# Patient Record
Sex: Female | Born: 1955
Health system: Southern US, Community
[De-identification: ages and names within clinical notes are randomized; demographics above are authoritative.]

## PROBLEM LIST (undated history)

## (undated) DIAGNOSIS — S32009A Unspecified fracture of unspecified lumbar vertebra, initial encounter for closed fracture: Secondary | ICD-10-CM

## (undated) DIAGNOSIS — K429 Umbilical hernia without obstruction or gangrene: Secondary | ICD-10-CM

## (undated) DIAGNOSIS — J309 Allergic rhinitis, unspecified: Secondary | ICD-10-CM

## (undated) DIAGNOSIS — Z803 Family history of malignant neoplasm of breast: Secondary | ICD-10-CM

## (undated) DIAGNOSIS — I10 Essential (primary) hypertension: Secondary | ICD-10-CM

## (undated) DIAGNOSIS — Z9289 Personal history of other medical treatment: Secondary | ICD-10-CM

## (undated) DIAGNOSIS — E669 Obesity, unspecified: Secondary | ICD-10-CM

## (undated) HISTORY — DX: Family history of malignant neoplasm of breast: Z80.3

## (undated) HISTORY — DX: Umbilical hernia without obstruction or gangrene: K42.9

## (undated) HISTORY — DX: Unspecified fracture of unspecified lumbar vertebra, initial encounter for closed fracture: S32.009A

## (undated) HISTORY — PX: DILATION AND CURETTAGE OF UTERUS: SHX78

## (undated) HISTORY — DX: Essential (primary) hypertension: I10

## (undated) HISTORY — PX: BACK SURGERY: SHX140

## (undated) HISTORY — DX: Allergic rhinitis, unspecified: J30.9

## (undated) HISTORY — DX: Obesity, unspecified: E66.9

## (undated) HISTORY — DX: Personal history of other medical treatment: Z92.89

---

## 2001-03-08 HISTORY — PX: HYSTEROSCOPY W/D&C: SHX1775

## 2001-03-08 HISTORY — PX: HYSTEROSCOPY WITH D & C: SHX1775

## 2005-08-16 ENCOUNTER — Ambulatory Visit: Payer: Self-pay | Admitting: Unknown Physician Specialty

## 2006-08-22 ENCOUNTER — Ambulatory Visit: Payer: Self-pay | Admitting: Unknown Physician Specialty

## 2007-08-24 ENCOUNTER — Ambulatory Visit: Payer: Self-pay | Admitting: Unknown Physician Specialty

## 2008-08-30 ENCOUNTER — Ambulatory Visit: Payer: Self-pay | Admitting: Unknown Physician Specialty

## 2009-01-06 HISTORY — PX: COLONOSCOPY: SHX174

## 2009-01-24 ENCOUNTER — Ambulatory Visit: Payer: Self-pay | Admitting: Gastroenterology

## 2009-09-05 ENCOUNTER — Ambulatory Visit: Payer: Self-pay | Admitting: Unknown Physician Specialty

## 2010-10-06 ENCOUNTER — Ambulatory Visit: Payer: Self-pay | Admitting: Unknown Physician Specialty

## 2011-10-13 ENCOUNTER — Ambulatory Visit: Payer: Self-pay | Admitting: Obstetrics and Gynecology

## 2012-10-17 ENCOUNTER — Ambulatory Visit: Payer: Self-pay | Admitting: Obstetrics and Gynecology

## 2013-10-18 ENCOUNTER — Ambulatory Visit: Payer: Self-pay

## 2014-10-14 ENCOUNTER — Other Ambulatory Visit: Payer: Self-pay | Admitting: Certified Nurse Midwife

## 2014-10-14 DIAGNOSIS — Z1231 Encounter for screening mammogram for malignant neoplasm of breast: Secondary | ICD-10-CM

## 2014-10-21 ENCOUNTER — Ambulatory Visit
Admission: RE | Admit: 2014-10-21 | Discharge: 2014-10-21 | Disposition: A | Discharge status: Home / Self Care | Payer: BLUE CROSS/BLUE SHIELD | Source: Ambulatory Visit | Attending: Certified Nurse Midwife | Admitting: Certified Nurse Midwife

## 2014-10-21 DIAGNOSIS — Z1231 Encounter for screening mammogram for malignant neoplasm of breast: Secondary | ICD-10-CM | POA: Diagnosis not present

## 2015-04-21 DIAGNOSIS — S32009A Unspecified fracture of unspecified lumbar vertebra, initial encounter for closed fracture: Secondary | ICD-10-CM

## 2015-04-21 HISTORY — DX: Unspecified fracture of unspecified lumbar vertebra, initial encounter for closed fracture: S32.009A

## 2015-04-24 ENCOUNTER — Emergency Department
Admission: EM | Admit: 2015-04-24 | Discharge: 2015-04-24 | Disposition: A | Discharge status: Home / Self Care | Payer: BLUE CROSS/BLUE SHIELD | Attending: Emergency Medicine | Admitting: Emergency Medicine

## 2015-04-24 ENCOUNTER — Encounter: Payer: Self-pay | Admitting: Emergency Medicine

## 2015-04-24 ENCOUNTER — Emergency Department: Payer: BLUE CROSS/BLUE SHIELD

## 2015-04-24 DIAGNOSIS — S32009A Unspecified fracture of unspecified lumbar vertebra, initial encounter for closed fracture: Secondary | ICD-10-CM

## 2015-04-24 DIAGNOSIS — I10 Essential (primary) hypertension: Secondary | ICD-10-CM | POA: Diagnosis not present

## 2015-04-24 DIAGNOSIS — S32058A Other fracture of fifth lumbar vertebra, initial encounter for closed fracture: Secondary | ICD-10-CM | POA: Insufficient documentation

## 2015-04-24 DIAGNOSIS — Y9289 Other specified places as the place of occurrence of the external cause: Secondary | ICD-10-CM | POA: Diagnosis not present

## 2015-04-24 DIAGNOSIS — S3992XA Unspecified injury of lower back, initial encounter: Secondary | ICD-10-CM | POA: Diagnosis present

## 2015-04-24 DIAGNOSIS — Y9389 Activity, other specified: Secondary | ICD-10-CM | POA: Insufficient documentation

## 2015-04-24 DIAGNOSIS — W010XXA Fall on same level from slipping, tripping and stumbling without subsequent striking against object, initial encounter: Secondary | ICD-10-CM | POA: Diagnosis not present

## 2015-04-24 DIAGNOSIS — Y998 Other external cause status: Secondary | ICD-10-CM | POA: Diagnosis not present

## 2015-04-24 MED ORDER — OXYCODONE-ACETAMINOPHEN 7.5-325 MG PO TABS: 1.0000 | ORAL_TABLET | ORAL | Status: AC | PRN

## 2015-04-24 MED ORDER — OXYCODONE-ACETAMINOPHEN 5-325 MG PO TABS
1.0000 | ORAL_TABLET | Freq: Once | ORAL | Status: AC
  Administered 2015-04-24: 1 via ORAL
  Filled 2015-04-24: qty 1

## 2015-04-24 MED ORDER — IBUPROFEN 800 MG PO TABS
800.0000 mg | ORAL_TABLET | Freq: Once | ORAL | Status: AC
  Administered 2015-04-24: 800 mg via ORAL

## 2015-04-24 MED ORDER — IBUPROFEN 600 MG PO TABS: 600.0000 mg | ORAL_TABLET | Freq: Three times a day (TID) | ORAL | Status: DC | PRN

## 2015-04-24 MED ORDER — IBUPROFEN 800 MG PO TABS
ORAL_TABLET | ORAL | Status: AC
  Filled 2015-04-24: qty 1

## 2015-04-24 NOTE — ED Provider Notes (Signed)
[facility] Emergency Department Provider Note  ____________________________________________  Time seen: Approximately 9:16 PM  I have reviewed the triage vital signs and the nursing notes.   HISTORY  Chief Complaint Back Pain    HPI Sierra Gonzalez is a 60 y.o. female patient complaining of low back pain secondary to fall 4 days ago.He slipped and fell on steps trying to avoid a dog. Patient states increasing low back and coccyx pain since the incident. Patient stated over-the-counter anti-inflammatories has not helped her condition. Patient denies any radicular component to this pain she denies any bladder or bowel dysfunction. Patient rates the pain as a 7/10. Patient describes the pain as "sharp".   History reviewed. No pertinent past medical history.  There are no active problems to display for this patient.   History reviewed. No pertinent past surgical history.  Current Outpatient Rx  Name  Route  Sig  Dispense  Refill  . ibuprofen (ADVIL,MOTRIN) 600 MG tablet   Oral   Take 1 tablet (600 mg total) by mouth every 8 (eight) hours as needed.   30 tablet   0   . oxyCODONE-acetaminophen (PERCOCET) 7.5-325 MG tablet   Oral   Take 1 tablet by mouth every 4 (four) hours as needed for severe pain.   20 tablet   0     Allergies Prednisone  Family History  Problem Relation Age of Onset  . Breast cancer Mother   . Breast cancer Sister   . Breast cancer Maternal Aunt   . Breast cancer Paternal Aunt   . Colon cancer Maternal Grandmother     Social History Social History  Substance Use Topics  . Smoking status: Never Smoker   . Smokeless tobacco: None  . Alcohol Use: No    Review of Systems Constitutional: No fever/chills Eyes: No visual changes. ENT: No sore throat. Cardiovascular: Denies chest pain. Respiratory: Denies shortness of breath. Gastrointestinal: No abdominal pain.  No nausea, no vomiting.  No diarrhea.  No  constipation. Genitourinary: Negative for dysuria. Musculoskeletal: Negative for back pain. Skin: Negative for rash. Neurological: Negative for headaches, focal weakness or numbness. Endocrine:Hypertension Allergic/Immunilogical: Prednisone  10-point ROS otherwise negative.  ____________________________________________   PHYSICAL EXAM:  VITAL SIGNS: ED Triage Vitals  Enc Vitals Group     BP 04/24/15 2052 155/81 mmHg     Pulse Rate 04/24/15 2052 79     Resp 04/24/15 2052 18     Temp 04/24/15 2052 98.4 F (36.9 C)     Temp Source 04/24/15 2052 Oral     SpO2 04/24/15 2052 95 %     Weight 04/24/15 2052 218 lb (98.884 kg)     Height 04/24/15 2052  (1.651 m)     Head Cir --      Peak Flow --      Pain Score 04/24/15 2053 7     Pain Loc --      Pain Edu? --      Excl. in GC? --     Constitutional: Alert and oriented. Well appearing and in no acute distress. Eyes: Conjunctivae are normal. PERRL. EOMI. Head: Atraumatic. Nose: No congestion/rhinnorhea. Mouth/Throat: Mucous membranes are moist.  Oropharynx non-erythematous. Neck: No stridor.  No cervical spine tenderness to palpation. Hematological/Lymphatic/Immunilogical: No cervical lymphadenopathy. Cardiovascular: Normal rate, regular rhythm. Grossly normal heart sounds.  Good peripheral circulation. Respiratory: Normal respiratory effort.  No retractions. Lungs CTAB. Gastrointestinal: Soft and nontender. No distention. No abdominal bruits. No CVA tenderness. Musculoskeletal:  No spinal deformity. Moderate guarding palpation L4-S1. Patient has negative straight leg test. Neurologic:  Normal speech and language. No gross focal neurologic deficits are appreciated. No gait instability. Skin:  Skin is warm, dry and intact. No rash noted. Psychiatric: Mood and affect are normal. Speech and behavior are normal.  ____________________________________________   LABS (all labs ordered are listed, but only abnormal results are  displayed)  Labs Reviewed - No data to display ____________________________________________  EKG   ____________________________________________  RADIOLOGY  Mild depressed superior  Endplate fracture L5.  I, Joni Reining, personally viewed and evaluated these images (plain radiographs) as part of my medical decision making, as well as reviewing the written report by the radiologist.  ____________________________________________   PROCEDURES  Procedure(s) performed: None  Critical Care performed: No  ____________________________________________   INITIAL IMPRESSION / ASSESSMENT AND PLAN / ED COURSE  Pertinent labs & imaging results that were available during my care of the patient were reviewed by me and considered in my medical decision making (see chart for details).  Mild depressed superior endplate fracture of L5 vertebral body. Discussed x-ray findings with patient. Patient given a prescription for Percocet and ibuprofen and advised follow-up with orthopedic doctor for further evaluation and treatment. ____________________________________________   FINAL CLINICAL IMPRESSION(S) / ED DIAGNOSES  Final diagnoses:  Fracture of lumbar vertebra, closed, initial encounter      Joni Reining, PA-C 04/24/15 2221  Myrna Blazer, MD 04/24/15 2322

## 2015-04-24 NOTE — ED Notes (Signed)
P_t arrived to the ED for complaints of lower back pain secondary to a fall sustained 4 days ago. Pt denies LOC. Pt is AOx4 in no apparent distress.

## 2015-04-24 NOTE — Discharge Instructions (Signed)
Lumbar Fracture °A lumbar fracture is a break in one of the bones of the lower back. Lumbar fractures range in severity. Severe fractures can damage the spinal cord. °CAUSES °This condition may be caused by: °· A fall (common). °· A car accident (common). °· A gunshot wound. °· A hard, direct hit to the back. °· Osteoporosis. °SYMPTOMS °The main symptom of this condition is severe pain in the lower back. If a fracture is complex or severe, there may also be: °· A misshapen or swollen area on the lower back. °· A limited ability to move an area of the lower back. °· An inability to empty the bladder or bowel. °· A loss of strength or sensation in the legs, feet, and toes. °· Paralysis. °DIAGNOSIS °This condition is diagnosed based on: °· A physical exam. °· Symptoms and what happened just before they developed. °· The results of imaging tests, such as an X-ray, CT scan, or MRI. °If your nerves have been damaged, you may also have other tests to find out how much damage there is. °TREATMENT °Treatment for this condition depends on the specifics of the injury. Most fractures can be treated with: °· A back brace. °· Bed rest and activity restrictions. °· Pain medicine. °· Physical therapy. °Fractures that are complex, involve multiple bones, or make the spine unstable may require surgery to remove pressure from the nerves or spinal cord and to stabilize the broken pieces of bone. °During recovery, it is normal to have pain and stiffness in the back for weeks. °HOME CARE INSTRUCTIONS °Medicines °· Take medicines only as directed by your health care provider. °· Do not drive or operate heavy machinery while taking pain medicine. ° Activity °· Stay in bed for as long as directed by your health care provider. °· If you were shown how to do any exercises to improve motion and strength in your back, do them as directed by your health care provider. °· Return to your normal activities as directed by your health care provider.  Ask your health care provider what activities are safe for you. °General Instructions °· If you were given a neck brace or back brace, wear it as directed by your health care provider. °· Keep all follow-up visits as directed by your health care provider. This is important. Failure to follow-up as recommended could result in permanent injury, disability, and long-lasting (chronic) pain. °SEEK MEDICAL CARE IF: °· Your pain does not improve over time. °· You have a persistent cough. °· You cannot return to your normal activities as planned or expected. °SEEK IMMEDIATE MEDICAL CARE IF: °· You have severe pain or your pain suddenly gets worse. °· You are unable to move. °· You have numbness, tingling, weakness, or paralysis in any part of your body. °· You cannot control your bladder or bowel. °· You have difficulty breathing. °· You have a fever. °· You have pain in your chest or abdomen. °· You vomit. °  °This information is not intended to replace advice given to you by your health care provider. Make sure you discuss any questions you have with your health care provider. °  °Document Released: 06/09/2006 Document Revised: 07/09/2014 Document Reviewed: 02/18/2014 °Elsevier Interactive Patient Education ©2016 [employer] ° °

## 2015-10-14 ENCOUNTER — Other Ambulatory Visit: Payer: Self-pay | Admitting: Certified Nurse Midwife

## 2015-10-14 DIAGNOSIS — Z1231 Encounter for screening mammogram for malignant neoplasm of breast: Secondary | ICD-10-CM

## 2015-10-30 ENCOUNTER — Other Ambulatory Visit: Payer: Self-pay | Admitting: Certified Nurse Midwife

## 2015-10-30 ENCOUNTER — Ambulatory Visit
Admission: RE | Admit: 2015-10-30 | Discharge: 2015-10-30 | Disposition: A | Discharge status: Home / Self Care | Payer: BLUE CROSS/BLUE SHIELD | Source: Ambulatory Visit | Attending: Certified Nurse Midwife | Admitting: Certified Nurse Midwife

## 2015-10-30 DIAGNOSIS — Z1231 Encounter for screening mammogram for malignant neoplasm of breast: Secondary | ICD-10-CM

## 2015-10-30 DIAGNOSIS — R928 Other abnormal and inconclusive findings on diagnostic imaging of breast: Secondary | ICD-10-CM | POA: Insufficient documentation

## 2015-11-05 ENCOUNTER — Other Ambulatory Visit: Payer: Self-pay | Admitting: Certified Nurse Midwife

## 2015-11-05 DIAGNOSIS — N6320 Unspecified lump in the left breast, unspecified quadrant: Secondary | ICD-10-CM

## 2015-11-05 DIAGNOSIS — R921 Mammographic calcification found on diagnostic imaging of breast: Secondary | ICD-10-CM

## 2015-11-28 ENCOUNTER — Ambulatory Visit
Admission: RE | Admit: 2015-11-28 | Discharge: 2015-11-28 | Disposition: A | Discharge status: Home / Self Care | Payer: BLUE CROSS/BLUE SHIELD | Source: Ambulatory Visit | Attending: Certified Nurse Midwife | Admitting: Certified Nurse Midwife

## 2015-11-28 DIAGNOSIS — N63 Unspecified lump in breast: Secondary | ICD-10-CM | POA: Diagnosis present

## 2015-11-28 DIAGNOSIS — R921 Mammographic calcification found on diagnostic imaging of breast: Secondary | ICD-10-CM

## 2015-11-28 DIAGNOSIS — N6320 Unspecified lump in the left breast, unspecified quadrant: Secondary | ICD-10-CM

## 2015-12-19 ENCOUNTER — Other Ambulatory Visit: Payer: Self-pay | Admitting: Certified Nurse Midwife

## 2015-12-19 DIAGNOSIS — R921 Mammographic calcification found on diagnostic imaging of breast: Secondary | ICD-10-CM

## 2016-05-28 ENCOUNTER — Ambulatory Visit
Admission: RE | Admit: 2016-05-28 | Discharge: 2016-05-28 | Disposition: A | Discharge status: Home / Self Care | Payer: BLUE CROSS/BLUE SHIELD | Source: Ambulatory Visit | Attending: Certified Nurse Midwife | Admitting: Certified Nurse Midwife

## 2016-05-28 ENCOUNTER — Other Ambulatory Visit: Payer: BLUE CROSS/BLUE SHIELD

## 2016-05-28 ENCOUNTER — Ambulatory Visit: Payer: BLUE CROSS/BLUE SHIELD

## 2016-05-28 DIAGNOSIS — R921 Mammographic calcification found on diagnostic imaging of breast: Secondary | ICD-10-CM

## 2016-11-16 ENCOUNTER — Encounter: Payer: Self-pay | Admitting: Certified Nurse Midwife

## 2016-11-16 ENCOUNTER — Ambulatory Visit (INDEPENDENT_AMBULATORY_CARE_PROVIDER_SITE_OTHER): Payer: BLUE CROSS/BLUE SHIELD | Admitting: Certified Nurse Midwife

## 2016-11-16 VITALS — BP 130/80 | HR 72 | Ht 65.0 in | Wt 220.0 lb

## 2016-11-16 DIAGNOSIS — Z803 Family history of malignant neoplasm of breast: Secondary | ICD-10-CM

## 2016-11-16 DIAGNOSIS — Z124 Encounter for screening for malignant neoplasm of cervix: Secondary | ICD-10-CM

## 2016-11-16 DIAGNOSIS — Z1239 Encounter for other screening for malignant neoplasm of breast: Secondary | ICD-10-CM

## 2016-11-16 DIAGNOSIS — R921 Mammographic calcification found on diagnostic imaging of breast: Secondary | ICD-10-CM

## 2016-11-16 DIAGNOSIS — Z01419 Encounter for gynecological examination (general) (routine) without abnormal findings: Secondary | ICD-10-CM | POA: Diagnosis not present

## 2016-11-16 DIAGNOSIS — Z1231 Encounter for screening mammogram for malignant neoplasm of breast: Secondary | ICD-10-CM

## 2016-11-16 NOTE — Progress Notes (Signed)
Gynecology Annual Exam  PCP: [name], CNM  Chief Complaint:  Chief Complaint  Patient presents with  . Gynecologic Exam    pain lower right buttock that goes down leg    History of Present Illness:Sierra Gonzalez presents today for her annual gyn exam. She is a 61 year old African American/Black female, G2 P2002, whose LMP was 08/07/2007. She is having no significant GYN problems.  Her menses are absent and she is postmenopausal. She does have occ hot flashes, especially with exertion, but they have decreased in frequency. She has had no spotting.   The patient's past medical history is notable for a history of hypertension, an umbilical hernia (unrepaired), and a very strong family history of breast cancer..  Since her last annual GYN exam dated 10/10/2015 , she has had no new problems until recently when she began having intermittent pain in her right buttock that would extend down her leg to her knee. No numbness or tingling in her right foot. This seems to bother her less when she wears her back brace. This pain was aggravated by riding in the car to the beach vacation without her back brace. Had surgery on her back when she was 61yo. She is not sexually active. She has been sexually active in the past.   Her most recent pap smear was obtained 10/10/2015 and was normal.  Her most recent annual mammogram on 10/30/15 and follow up left diagnostic mammogram 11/28/15 revealed benign appearing calcifications. She had a follow up left diagnostic mammogram in March 2018 and the calcifications were stable. There is a positive history of breast cancer in her mother, sister, maternal aunt, and Two paternal aunts. Genetic testing has not been done. There is no family history of ovarian cancer. The patient does do monthly self breast exams.  She had a colonoscopy in 01/2009 that was normal. Her next colonoscopy is due in 10 years.  She denies a recent DEXA scan and is eligible.  The  patient does not smoke.  The patient does not drink alcohol.  The patient does not use illegal drugs.  The patient exercises occasionally.  The patient does get adequate calcium in her diet.  She had a recent cholesterol screen and EKG at her PCP and reports everything was normal. The patient denies current symptoms of depression.    Review of Systems: Review of Systems  Constitutional: Negative for chills, fever and weight loss.       Positive for 8# weight gain in the past year.  HENT: Negative for congestion, sinus pain and sore throat.   Eyes: Negative for blurred vision and pain.  Respiratory: Negative for hemoptysis, shortness of breath and wheezing.   Cardiovascular: Negative for chest pain, palpitations and leg swelling.  Gastrointestinal: Negative for abdominal pain, blood in stool, diarrhea, heartburn, nausea and vomiting.  Genitourinary: Negative for dysuria, frequency, hematuria and urgency.  Musculoskeletal: Positive for back pain. Negative for joint pain and myalgias.       Positive for pain in right buttock radiating to knee  Skin: Negative for itching and rash.  Neurological: Negative for dizziness, tingling and headaches.  Endo/Heme/Allergies: Negative for environmental allergies and polydipsia. Does not bruise/bleed easily.       Negative for hirsutism   Psychiatric/Behavioral: Negative for depression. The patient is not nervous/anxious and does not have insomnia.     Past Medical History:  Past Medical History:  Diagnosis Date  . Family history of breast cancer   .  Hypertension   . Obesity (BMI 30-39.9)   . Umbilical hernia     Past Surgical History:  Past Surgical History:  Procedure Laterality Date  . BACK SURGERY  age 80   had a bone inserted in upper spine  . CESAREAN SECTION     x2  . COLONOSCOPY  01/2009  . HYSTEROSCOPY W/D&C  2003    Family History:  Family History  Problem Relation Age of Onset  . Breast cancer Mother [id number]       premenopausal   . Breast cancer Sister 3638  . Breast cancer Maternal Aunt 5367  . Breast cancer Paternal Aunt 5460  . Colon cancer Maternal Grandmother [id number]  . Diabetes Father   . Hypertension Father   . Diabetes Mellitus II Brother   . Breast cancer Paternal Aunt [id number]    Social History:  Social History   Social History  . Marital status: Married    Spouse name: Winford  . Number of children: 2  . Years of education: N/A   Occupational History  . Not on file.   Social History Main Topics  . Smoking status: Never Smoker  . Smokeless tobacco: Never Used  . Alcohol use No  . Drug use: No  . Sexual activity: Not Currently    Partners: Male    Birth control/ protection: Post-menopausal   Other Topics Concern  . Not on file   Social History Narrative  . No narrative on file    Allergies:  Allergies  Allergen Reactions  . Prednisone Swelling    Medications:  Current Outpatient Prescriptions:  .  cetirizine (ZYRTEC) 5 MG tablet, Take 1 tablet by mouth daily., Disp: , Rfl:  .  ibuprofen (ADVIL,MOTRIN) 600 MG tablet, Take 1 tablet (600 mg total) by mouth every 8 (eight) hours as needed., Disp: 30 tablet, Rfl: 0 .  lisinopril-hydrochlorothiazide (PRINZIDE,ZESTORETIC) 10-12.5 MG tablet, Take 1 tablet by mouth daily., Disp: , Rfl: 0   Physical Exam Vitals: BP 130/80   Pulse 72   Ht 5\' 5"  (1.651 m)   Wt 220 lb (99.8 kg)   BMI 36.61 kg/m   General: BF in NAD HEENT: normocephalic, anicteric Neck: no thyroid enlargement, no palpable nodules, no cervical lymphadenopathy  Pulmonary: No increased work of breathing, CTAB Cardiovascular: RRR, without murmur  Breast: Breast large and symmetrical, no tenderness, no palpable nodules or masses, no skin or nipple retraction present, no nipple discharge.  No axillary, infraclavicular or supraclavicular lymphadenopathy. Abdomen: Soft, obese, non-tender, non-distended.  Umbilical hernia present.  No hepatomegaly or masses palpable.   Genitourinary:  External: Normal external female genitalia.  Normal urethral meatus, normal Bartholin's and Skene's glands.    Vagina: Normal vaginal mucosa, no evidence of prolapse.    Cervix: unable to visualize, no bleeding, non-tender  Uterus: Anteverted, normal size, shape, and consistency, mobile, and non-tender. Difficult exam due to body habitus  Adnexa: No adnexal masses, non-tender  Rectal: deferred  Lymphatic: no evidence of inguinal lymphadenopathy Extremities: no edema, erythema, or tenderness Neurologic: Grossly intact Psychiatric: mood appropriate, affect full     Assessment: 61 y.o. G2P0002 annual gyn exam Left breast calcifications Umbilical hernia-asymptomatic Family history of breast cancer Possible right sciatica- discussed stretches that may help alleviate discomfort.  Plan:   1) Breast cancer screening - recommend monthly self breast exam and annual mammograms. Needs bilateral diagnostic mammogram scheduled. Mammogram was ordered today. Has been offered genetic testing in the past and has declined  2) Colon cancer screening: colonoscopy  due in 2020  3) Cervical cancer screening - Pap was done. ASCCP guidelines and rational discussed.  Patient opts for yearly screening interval  4) Osteoporosis prevention: discussed calcium and vitamin D3 requirements. Discussed importance of exercise. Plan for DEXA scan next year.  5) Routine healthcare maintenance including cholesterol and diabetes screening managed by PCP   6) RTO in 1 year and prn

## 2016-11-17 ENCOUNTER — Telehealth: Payer: Self-pay | Admitting: Certified Nurse Midwife

## 2016-11-17 ENCOUNTER — Encounter: Payer: Self-pay | Admitting: Certified Nurse Midwife

## 2016-11-17 DIAGNOSIS — E669 Obesity, unspecified: Secondary | ICD-10-CM | POA: Insufficient documentation

## 2016-11-17 DIAGNOSIS — Z803 Family history of malignant neoplasm of breast: Secondary | ICD-10-CM | POA: Insufficient documentation

## 2016-11-17 DIAGNOSIS — I10 Essential (primary) hypertension: Secondary | ICD-10-CM | POA: Insufficient documentation

## 2016-11-17 DIAGNOSIS — K429 Umbilical hernia without obstruction or gangrene: Secondary | ICD-10-CM | POA: Insufficient documentation

## 2016-11-17 NOTE — Telephone Encounter (Signed)
[facility] Thursday, 12/09/16, @ 2:00pm.

## 2016-11-18 ENCOUNTER — Encounter: Payer: Self-pay | Admitting: Obstetrics and Gynecology

## 2016-11-19 LAB — IGP, APTIMA HPV
HPV Aptima: NEGATIVE
PAP Smear Comment: 0

## 2016-12-09 ENCOUNTER — Ambulatory Visit
Admission: RE | Admit: 2016-12-09 | Discharge: 2016-12-09 | Disposition: A | Discharge status: Home / Self Care | Payer: BLUE CROSS/BLUE SHIELD | Source: Ambulatory Visit | Attending: Certified Nurse Midwife | Admitting: Certified Nurse Midwife

## 2016-12-09 DIAGNOSIS — R921 Mammographic calcification found on diagnostic imaging of breast: Secondary | ICD-10-CM | POA: Insufficient documentation

## 2016-12-09 DIAGNOSIS — Z1239 Encounter for other screening for malignant neoplasm of breast: Secondary | ICD-10-CM

## 2016-12-21 ENCOUNTER — Telehealth: Payer: Self-pay

## 2016-12-21 NOTE — Telephone Encounter (Signed)
Pt called stating she recently had an appointment with [facility] and the city of [place] was going to fax [facility] lab results. Pt wondering if [facility] had received them. If results have not been received call patient and she can bring a copy. [phone]  I checked in chart and I did not see any results. Message sent to [facility].

## 2016-12-21 NOTE — Telephone Encounter (Signed)
I do not see this in media. Have you received these lab results via fax? [employer]

## 2017-03-31 ENCOUNTER — Other Ambulatory Visit: Payer: Self-pay | Admitting: Family Medicine

## 2017-03-31 ENCOUNTER — Ambulatory Visit
Admission: RE | Admit: 2017-03-31 | Discharge: 2017-03-31 | Disposition: A | Discharge status: Home / Self Care | Payer: BLUE CROSS/BLUE SHIELD | Source: Ambulatory Visit | Attending: Family Medicine | Admitting: Family Medicine

## 2017-03-31 DIAGNOSIS — R600 Localized edema: Secondary | ICD-10-CM

## 2017-03-31 DIAGNOSIS — M79604 Pain in right leg: Secondary | ICD-10-CM

## 2017-11-29 ENCOUNTER — Other Ambulatory Visit (HOSPITAL_COMMUNITY)
Admission: RE | Admit: 2017-11-29 | Discharge: 2017-11-29 | Disposition: A | Discharge status: Home / Self Care | Payer: BLUE CROSS/BLUE SHIELD | Source: Ambulatory Visit | Attending: Obstetrics and Gynecology | Admitting: Obstetrics and Gynecology

## 2017-11-29 ENCOUNTER — Ambulatory Visit (INDEPENDENT_AMBULATORY_CARE_PROVIDER_SITE_OTHER): Payer: BLUE CROSS/BLUE SHIELD | Admitting: Certified Nurse Midwife

## 2017-11-29 ENCOUNTER — Encounter: Payer: Self-pay | Admitting: Certified Nurse Midwife

## 2017-11-29 VITALS — BP 150/90 | HR 60 | Ht 64.0 in | Wt 227.0 lb

## 2017-11-29 DIAGNOSIS — Z1231 Encounter for screening mammogram for malignant neoplasm of breast: Secondary | ICD-10-CM | POA: Diagnosis not present

## 2017-11-29 DIAGNOSIS — Z124 Encounter for screening for malignant neoplasm of cervix: Secondary | ICD-10-CM

## 2017-11-29 DIAGNOSIS — Z01419 Encounter for gynecological examination (general) (routine) without abnormal findings: Secondary | ICD-10-CM

## 2017-11-29 DIAGNOSIS — Z01411 Encounter for gynecological examination (general) (routine) with abnormal findings: Secondary | ICD-10-CM

## 2017-11-29 DIAGNOSIS — Z803 Family history of malignant neoplasm of breast: Secondary | ICD-10-CM

## 2017-11-29 DIAGNOSIS — Z1239 Encounter for other screening for malignant neoplasm of breast: Secondary | ICD-10-CM

## 2017-11-29 DIAGNOSIS — R7309 Other abnormal glucose: Secondary | ICD-10-CM

## 2017-11-29 NOTE — Progress Notes (Signed)
Gynecology Annual Exam  PCP: [name], CNM  Chief Complaint:  Chief Complaint  Patient presents with  . Gynecologic Exam    History of Present Illness:Sierra Gonzalez presents today for her annual gyn exam. She is a 62 year old African American/Black female, G2 P2002, whose LMP was 08/07/2007. She is having no significant GYN problems.  Her menses are absent and she is postmenopausal. She does have occ hot flashes. She has had no spotting.   The patient's past medical history is notable for a history of hypertension, an umbilical hernia (unrepaired), and a very strong family history of breast cancer. She also has a history of having back surgery when she was 62 years old. Since her last annual GYN exam dated 11/16/2016 , she has had no significant change in her health.  She is not sexually active. She has been sexually active in the past.   Her most recent pap smear was obtained 11/16/2016 and it was NIL/neg HRHPV  Her most recent annual mammogram on 12/09/2016 was benign/ stable. There is a positive history of breast cancer in her mother, sister, maternal aunt, and two paternal aunts. Genetic testing has not been done. There is no family history of ovarian cancer. The patient does do monthly self breast exams.  She had a colonoscopy in 01/2009 that was normal. Her next colonoscopy is due next year.  She denies a recent DEXA scan and is eligible.  The patient does not smoke.  The patient does not drink alcohol.  The patient does not use illegal drugs.  The patient exercises occasionally.  The patient may not get adequate calcium in her diet.  She had a recent cholesterol screen and EKG at her PCP and reports everything was normal; however, her hemoglobin A1C was 6.9%. Her lipid panel was normal. Her current BMI is 38.96 kg/m2 and she has gained 7# since last year. Her PCp has discussed weight loss, dietary changes and walking for exercise The patient denies current symptoms  of depression.    Review of Systems: Review of Systems  Constitutional: Negative for chills, fever and weight loss.       Positive for 7# weight gain in the past year and 15# weight gain in the last 2 years  HENT: Negative for congestion, sinus pain and sore throat.   Eyes: Negative for blurred vision and pain.  Respiratory: Negative for hemoptysis, shortness of breath and wheezing.   Cardiovascular: Negative for chest pain, palpitations and leg swelling.  Gastrointestinal: Negative for abdominal pain, blood in stool, diarrhea, heartburn, nausea and vomiting.  Genitourinary: Negative for dysuria, frequency, hematuria and urgency.  Musculoskeletal: Negative for back pain, joint pain and myalgias.  Skin: Negative for itching and rash.  Neurological: Negative for dizziness, tingling and headaches.  Endo/Heme/Allergies: Negative for environmental allergies and polydipsia. Does not bruise/bleed easily.       Negative for hirsutism   Psychiatric/Behavioral: Negative for depression. The patient is not nervous/anxious and does not have insomnia.     Past Medical History:  Past Medical History:  Diagnosis Date  . Allergic rhinitis   . Family history of breast cancer    9/18 genetic testing letter sent  . Fracture lumbar vertebra-closed ([facility]CC) 04/21/2015   FELL DOWN STEPS-FX A BONE IN LOWER PART OF HER BACK;   . History of mammogram 10/21/14; 11/28/15   NEG; ADD VIEWS LEFT BREAST-BIRADS 3  . Hypertension   . Obesity (BMI 30-39.9)   . Umbilical hernia  Past Surgical History:  Past Surgical History:  Procedure Laterality Date  . BACK SURGERY  age 43   had a bone inserted in upper spine  . CESAREAN SECTION     x2  . COLONOSCOPY  01/2009  . HYSTEROSCOPY W/D&C  2003    Family History:  Family History  Problem Relation Age of Onset  . Breast cancer Mother 2838       premenopausal  . Breast cancer Sister 3338  . Diabetes Sister   . Breast cancer Maternal Aunt 6667  . Breast cancer  Paternal Aunt 260  . Colon cancer Maternal Grandmother 7370  . Diabetes Father   . Hypertension Father   . Diabetes Mellitus II Brother   . Diabetes Brother   . Breast cancer Paternal Aunt 3260    Social History:  Social History   Socioeconomic History  . Marital status: Married    Spouse name: Winford  . Number of children: 2  . Years of education: 3214  . Highest education level: Not on file  Occupational History  . 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 Financial resource strain: Not on file  . Food insecurity:    Worry: Not on file    Inability: Not on file  . Transportation needs:    Medical: Not on file    Non-medical: Not on file  Tobacco Use  . Smoking status: Never Smoker  . Smokeless tobacco: Never Used  Substance and Sexual Activity  . Alcohol use: No  . Drug use: No  . Sexual activity: Not Currently    Partners: Male    Birth control/protection: Post-menopausal  Lifestyle  . Physical activity:    Days per week: Not on file    Minutes per session: Not on file  . Stress: Not on file  Relationships  . Social connections:    Talks on phone: Not on file    Gets together: Not on file    Attends religious service: Not on file    Active member of club or organization: Not on file    Attends meetings of clubs or organizations: Not on file    Relationship status: Not on file  . Intimate partner violence:    Fear of current or ex partner: Not on file    Emotionally abused: Not on file    Physically abused: Not on file    Forced sexual activity: Not on file  Other Topics Concern  . Not on file  Social History Narrative  . Not on file    Allergies:  Allergies  Allergen Reactions  . Prednisone Swelling    Medications:  Current Outpatient Medications:  .  cetirizine (ZYRTEC) 5 MG tablet, Take 1 tablet by mouth daily., Disp: , Rfl:  .  ibuprofen (ADVIL,MOTRIN) 600 MG tablet, Take 1 tablet (600 mg total) by mouth every 8  (eight) hours as needed., Disp: 30 tablet, Rfl: 0 .  lisinopril-hydrochlorothiazide (PRINZIDE,ZESTORETIC) 10-12.5 MG tablet, Take 1 tablet by mouth daily., Disp: , Rfl: 0   Physical Exam Vitals: BP (!) 150/90   Pulse 60   Ht 5\' 4"  (1.626 m)   Wt 227 lb (103 kg)   BMI 38.96 kg/m   General: BF in NAD HEENT: normocephalic, anicteric Neck: no thyroid enlargement, no palpable nodules, no cervical lymphadenopathy  Pulmonary: No increased work of breathing, CTAB Cardiovascular: RRR, without murmur  Breast: Breast large and symmetrical, no tenderness, no palpable nodules or masses,  no skin or nipple retraction present, no nipple discharge.  No axillary, infraclavicular or supraclavicular lymphadenopathy. Abdomen: Soft, obese, non-tender, non-distended.  Umbilical hernia present.  No hepatomegaly palpable.  Genitourinary:  External: Normal external female genitalia.  Normal urethral meatus, normal Bartholin's and Skene's glands.    Vagina: Normal vaginal mucosa, no evidence of prolapse.    Cervix: posterior, no bleeding, non-tender  Uterus: Anteverted, normal size, shape, and consistency, mobile, and non-tender. Difficult exam due to body habitus  Adnexa: No adnexal masses, non-tender  Rectal: deferred  Lymphatic: no evidence of inguinal lymphadenopathy Extremities: no edema, erythema, or tenderness Neurologic: Grossly intact Psychiatric: mood appropriate, affect full     Assessment: 62 y.o. [id number] annual gyn exam Umbilical hernia-asymptomatic Family history of breast cancer Elevated hemoglobin A1C: 6.9%   Plan:   1) Breast cancer screening - recommend monthly self breast exam and annual mammograms.  Mammogram was ordered today. Offered MYRISK testing due to family history of breast cancer. TC Model used to calculate lifetime risk of breast cancer and was 27.1%. Will send patient a copy of this. DIscussed increased surveillance for breast cancer including MRIs of the breast as well  as annual mammograms. Also discussed risk reducing strategies including Tamoxifen and subcutaneous mastectomies.  2) Colon cancer screening: colonoscopy due in 2020  3) Cervical cancer screening - Pap was done. ASCCP guidelines and rational discussed.  Patient opts for yearly screening interval  4) Osteoporosis prevention: discussed calcium and vitamin D3 requirements. Discussed importance of exercise. offered DEXA scan and she declines  5) Routine healthcare maintenance including cholesterol and diabetes screening managed by PCP   6) RTO in 1 year and prn

## 2017-11-29 NOTE — Patient Instructions (Signed)
Preventing Osteoporosis, Adult Osteoporosis is a condition that causes the bones to get weaker. With osteoporosis, the bones become thinner, and the normal spaces in bone tissue become larger. This can make the bones weak and cause them to break more easily. People who have osteoporosis are more likely to break their wrist, spine, or hip. Even a minor accident or injury can be enough to break weak bones. Osteoporosis can occur with aging. Your body constantly replaces old bone tissue with new tissue. As you get older, you may lose bone tissue more quickly, or it may be replaced more slowly. Osteoporosis is more likely to develop if you have poor nutrition or do not get enough calcium or vitamin D. Other lifestyle factors can also play a role. By making some diet and lifestyle changes, you can help to keep your bones healthy and help to prevent osteoporosis. What nutrition changes can be made? Nutrition plays an important role in maintaining healthy, strong bones.  Make sure you get enough calcium every day from food or from calcium supplements. ? If you are age 50 or younger, aim to get 1,000 mg of calcium every day. ? If you are older than age 50, aim to get 1,200 mg of calcium every day.  Try to get enough vitamin D every day. ? If you are age 70 or younger, aim to get 600 international units (IU) every day. ? If you are older than age 70, aim to get 800 international units every day.  Follow a healthy diet. Eat plenty of foods that contain calcium and vitamin D. ? Calcium is in milk, cheese, yogurt, and other dairy products. Some fish and vegetables are also good sources of calcium. Many foods such as cereals and breads have had calcium added to them (are fortified). Check nutrition labels to see how much calcium is in a food or drink. ? Foods that contain vitamin D include milk, cereals, salmon, and tuna. Your body also makes vitamin D when you are out in the sun. Bare skin exposure to the sun on  your face, arms, legs, or back for no more than 30 minutes a day, 2 times per week is more than enough. Beyond that, it is important to use sunblock to protect your skin from sunburn, which increases your risk for skin cancer.  What lifestyle changes can be made? Making changes in your everyday life can also play an important role in preventing osteoporosis.  Stay active and get exercise every day. Ask your health care provider what types of exercise are best for you.  Do not use any products that contain nicotine or tobacco, such as cigarettes and e-cigarettes. If you need help quitting, ask your health care provider.  Limit alcohol intake to no more than 1 drink a day for nonpregnant women and 2 drinks a day for men. One drink equals 12 oz of beer, 5 oz of wine, or 1 oz of hard liquor.  Why are these changes important? Making these nutrition and lifestyle changes can:  Help you develop and maintain healthy, strong bones.  Prevent loss of bone mass and the problems that are caused by that loss, such as broken bones and delayed healing.  Make you feel better mentally and physically.  What can happen if changes are not made? Problems that can result from osteoporosis can be very serious. These may include:  A higher risk of broken bones that are painful and do not heal well.  Physical malformations, such as   a collapsed spine or a hunched back.  Problems with movement.  Where to find support: If you need help making changes to prevent osteoporosis, talk with your health care provider. You can ask for a referral to a diet and nutrition specialist (dietitian) and a physical therapist. Where to find more information: Learn more about osteoporosis from:  NIH Osteoporosis and Related Bone Diseases National Resource Center: www.niams.nih.gov/health_info/bone/osteoporosis/osteoporosis_ff.asp  U.S. Office on Women's Health:  www.womenshealth.gov/publications/our-publications/fact-sheet/osteoporosis.html  National Osteoporosis Foundation: www.nof.org/patients/what-is-osteoporosis/  Summary  Osteoporosis is a condition that causes weak bones that are more likely to break.  Eating a healthy diet and making sure you get enough calcium and vitamin D can help prevent osteoporosis.  Other ways to reduce your risk of osteoporosis include getting regular exercise and avoiding alcohol and products that contain nicotine or tobacco. This information is not intended to replace advice given to you by your health care provider. Make sure you discuss any questions you have with your health care provider. Document Released: 03/09/2015 Document Revised: 11/03/2015 Document Reviewed: 11/03/2015 Elsevier Interactive Patient Education  2018 Elsevier Inc.  

## 2017-11-30 DIAGNOSIS — R7309 Other abnormal glucose: Secondary | ICD-10-CM | POA: Insufficient documentation

## 2017-12-01 LAB — CYTOLOGY - PAP
Adequacy: ABSENT
Diagnosis: NEGATIVE

## 2017-12-22 ENCOUNTER — Ambulatory Visit
Admission: RE | Admit: 2017-12-22 | Discharge: 2017-12-22 | Disposition: A | Discharge status: Home / Self Care | Payer: BLUE CROSS/BLUE SHIELD | Source: Ambulatory Visit | Attending: Certified Nurse Midwife | Admitting: Certified Nurse Midwife

## 2017-12-22 DIAGNOSIS — Z803 Family history of malignant neoplasm of breast: Secondary | ICD-10-CM

## 2017-12-22 DIAGNOSIS — Z1239 Encounter for other screening for malignant neoplasm of breast: Secondary | ICD-10-CM | POA: Insufficient documentation

## 2018-12-08 ENCOUNTER — Ambulatory Visit: Payer: Self-pay

## 2018-12-08 DIAGNOSIS — Z23 Encounter for immunization: Secondary | ICD-10-CM

## 2018-12-13 ENCOUNTER — Encounter: Payer: Self-pay | Admitting: Certified Nurse Midwife

## 2018-12-13 ENCOUNTER — Ambulatory Visit (INDEPENDENT_AMBULATORY_CARE_PROVIDER_SITE_OTHER): Payer: 59 | Admitting: Certified Nurse Midwife

## 2018-12-13 ENCOUNTER — Other Ambulatory Visit: Payer: Self-pay

## 2018-12-13 VITALS — BP 116/70 | HR 81 | Ht 64.0 in | Wt 218.0 lb

## 2018-12-13 DIAGNOSIS — Z0184 Encounter for antibody response examination: Secondary | ICD-10-CM

## 2018-12-13 DIAGNOSIS — Z1231 Encounter for screening mammogram for malignant neoplasm of breast: Secondary | ICD-10-CM

## 2018-12-13 DIAGNOSIS — R7309 Other abnormal glucose: Secondary | ICD-10-CM

## 2018-12-13 DIAGNOSIS — Z01419 Encounter for gynecological examination (general) (routine) without abnormal findings: Secondary | ICD-10-CM | POA: Diagnosis not present

## 2018-12-13 DIAGNOSIS — Z1211 Encounter for screening for malignant neoplasm of colon: Secondary | ICD-10-CM

## 2018-12-13 DIAGNOSIS — Z803 Family history of malignant neoplasm of breast: Secondary | ICD-10-CM

## 2018-12-13 NOTE — Progress Notes (Signed)
Gynecology Annual Exam  PCP: Farrel ConnersGutierrez, Natsha Guidry, CNM  Chief Complaint:  Chief Complaint  Patient presents with  . Gynecologic Exam    hot flashes; pain elbow joints;    History of Present Illness:Sierra M. Christell ConstantMoore presents today for her annual gyn exam. She is a 63 year old African American/Black female, G2 P2002, whose LMP was 08/07/2007. She is having no significant GYN problems.  Her menses are absent and she is postmenopausal. She does have occ hot flashes. She has had no spotting.   The patient's past medical history is notable for a history of hypertension, an umbilical hernia (unrepaired), and a very strong family history of breast cancer. She also has a history of having back surgery when she was 63 years old. Since her last annual GYN exam dated 11/29/2017 , she has had no significant change in her health. She has lost 9# since her last visit. She is not sexually active. She has been sexually active in the past.   Her most recent pap smear was obtained 11/29/2017 and it was NIL Her most recent annual mammogram on 12/22/2017 was negative. There is a positive history of breast cancer in her mother, sister, maternal aunt, and two paternal aunts. Genetic testing has not been done. There is no family history of ovarian cancer. The patient does do monthly self breast exams.  She had a colonoscopy in 01/2009 that was normal. Her next colonoscopy is due this year  She denies a recent DEXA scan and is eligible.  The patient does not smoke.  The patient does not drink alcohol.  The patient does not use illegal drugs.  The patient exercises occasionally.  The patient has increased the calcium in her diet and probably gets adequate calcium intake. She had a recent cholesterol screen and EKG at her PCP last year and reports everything was normal; however, her hemoglobin A1C was 6.9%. Her lipid panel was normal. Her current BMI is 37.4 kg/m2 and she has lost 9# since last year after her  PCP discussed weight loss, dietary changes and walking for exercise. Has not had another hemoglobin A1C The patient denies current symptoms of depression.   Immunizations: Had flu shot last week. Discussed getting Shingrix, but she is unsure whether she had chickenpox as a child.  Review of Systems: Review of Systems  Constitutional: Positive for weight loss (intentional). Negative for chills and fever.  HENT: Negative for congestion, sinus pain and sore throat.   Eyes: Negative for blurred vision and pain.  Respiratory: Negative for hemoptysis, shortness of breath and wheezing (bil elbows, intermittently).   Cardiovascular: Negative for chest pain, palpitations and leg swelling.  Gastrointestinal: Negative for abdominal pain, blood in stool, diarrhea, heartburn, nausea and vomiting.  Genitourinary: Negative for dysuria, frequency, hematuria and urgency.  Musculoskeletal: Positive for joint pain. Negative for back pain and myalgias.  Skin: Negative for itching and rash.  Neurological: Negative for dizziness, tingling and headaches.  Endo/Heme/Allergies: Negative for environmental allergies and polydipsia. Does not bruise/bleed easily.       Negative for hirsutism   Psychiatric/Behavioral: Negative for depression. The patient is not nervous/anxious and does not have insomnia.     Past Medical History:  Past Medical History:  Diagnosis Date  . Allergic rhinitis   . Family history of breast cancer    9/18 genetic testing letter sent  . Fracture lumbar vertebra-closed ([facility]CC) 04/21/2015   FELL DOWN STEPS-FX A BONE IN LOWER PART OF HER BACK;   .  History of mammogram 10/21/14; 11/28/15   NEG; ADD VIEWS LEFT BREAST-BIRADS 3  . Hypertension   . Obesity (BMI 30-39.9)   . Umbilical hernia     Past Surgical History:  Past Surgical History:  Procedure Laterality Date  . BACK SURGERY  age 89   had a bone inserted in upper spine  . CESAREAN SECTION     x2  . COLONOSCOPY  01/2009  .  HYSTEROSCOPY W/D&C  2003    Family History:  Family History  Problem Relation Age of Onset  . Breast cancer Mother 65       premenopausal  . Breast cancer Sister 34  . Diabetes Sister   . Breast cancer Maternal Aunt 67  . Breast cancer Paternal Aunt 34  . Colon cancer Maternal Grandmother 55  . Diabetes Father   . Hypertension Father   . Diabetes Mellitus II Brother   . Diabetes Brother   . Breast cancer Paternal Aunt 48    Social History:  Social History   Socioeconomic History  . Marital status: Married    Spouse name: Winford  . Number of children: 2  . Years of education: 80  . Highest education level: Not on file  Occupational History  . 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 Financial resource strain: Not on file  . Food insecurity    Worry: Not on file    Inability: Not on file  . Transportation needs    Medical: Not on file    Non-medical: Not on file  Tobacco Use  . Smoking status: Never Smoker  . Smokeless tobacco: Never Used  Substance and Sexual Activity  . Alcohol use: No  . Drug use: No  . Sexual activity: Not Currently    Partners: Male    Birth control/protection: Post-menopausal  Lifestyle  . Physical activity    Days per week: Not on file    Minutes per session: Not on file  . Stress: Not on file  Relationships  . Social Herbalist on phone: Not on file    Gets together: Not on file    Attends religious service: Not on file    Active member of club or organization: Not on file    Attends meetings of clubs or organizations: Not on file    Relationship status: Not on file  . Intimate partner violence    Fear of current or ex partner: Not on file    Emotionally abused: Not on file    Physically abused: Not on file    Forced sexual activity: Not on file  Other Topics Concern  . Not on file  Social History Narrative  . Not on file    Allergies:  Allergies  Allergen Reactions  .  Prednisone Swelling    Medications:  Current Outpatient Medications:  .  cetirizine (ZYRTEC) 5 MG tablet, Take 1 tablet by mouth daily., Disp: , Rfl:  .  ibuprofen (ADVIL,MOTRIN) 600 MG tablet, Take 1 tablet (600 mg total) by mouth every 8 (eight) hours as needed., Disp: 30 tablet, Rfl: 0 .  lisinopril-hydrochlorothiazide (PRINZIDE,ZESTORETIC) 10-12.5 MG tablet, Take 1 tablet by mouth daily., Disp: , Rfl: 0   Physical Exam Vitals: BP 116/70   Pulse 81   Ht 5\' 4"  (1.626 m)   Wt 218 lb (98.9 kg)   BMI 37.42 kg/m   General: BF in NAD HEENT: normocephalic, anicteric Neck: no thyroid enlargement, no  palpable nodules, no cervical lymphadenopathy  Pulmonary: No increased work of breathing, CTAB Cardiovascular: RRR, without murmur  Breast: Breast large and symmetrical, no tenderness, no palpable nodules or masses, no skin or nipple retraction present, no nipple discharge.  No axillary, infraclavicular or supraclavicular lymphadenopathy. Abdomen: Soft, obese, non-tender.  5-6 cm umbilical hernia present just to the right of midline.  No hepatomegaly palpable.  Genitourinary:  External: Normal external female genitalia.  Normal urethral meatus, normal Bartholin's and Skene's glands.    Vagina: Normal vaginal mucosa, no evidence of prolapse, small vaginal opening due to atrophic changes  Cervix: posterior, no bleeding, non-tender  Uterus: Anteverted/ midplane, normal size, shape, and consistency, mobile, and non-tender. Difficult exam due to body habitus  Adnexa: No adnexal masses, non-tender  Rectal: deferred  Lymphatic: no evidence of inguinal lymphadenopathy Extremities: no edema, erythema, or tenderness Neurologic: Grossly intact Psychiatric: mood appropriate, affect full     Assessment: 63 y.o. G2P0002 annual gyn exam Umbilical hernia-asymptomatic Family history of breast cancer Elevated hemoglobin A1C: 6.9%   Plan:   1) Breast cancer screening - recommend monthly self breast  exam and annual mammograms.  Mammogram was ordered today. Offered MYRISK testing due to family history of breast cancer last year and declined. TC Model used to calculate lifetime risk of breast cancer and was 27.1%. DIscussed increased surveillance for breast cancer including MRIs of the breast as well as annual mammograms last year. Declined MRIs last year. Also discussed risk reducing strategies including Tamoxifen and subcutaneous mastectomies last year.   2) Colon cancer screening: colonoscopy due -will consult GI  3) Cervical cancer screening - Pap was not done.  Patient opts for every 3 year screening interval  4) Osteoporosis prevention: adequate calcium and vitamins D3 intake and exercise. Offer DEXA at age 28  5) Elevated hemoglobin A1C last year. Repeat today along with Varicella titer. May want Shingrix if she has a positive titer.   6) RTO in 1 year and prn. Will call with lab results.  [name], CNM

## 2018-12-14 LAB — VARICELLA ZOSTER ANTIBODY, IGG: Varicella zoster IgG: 2708 index (ref >165)

## 2018-12-14 LAB — HEMOGLOBIN A1C
Est. average glucose Bld gHb Est-mCnc: 154 mg/dL
Hgb A1c MFr Bld: 7 % — ABNORMAL HIGH (ref 4.8–5.6)

## 2018-12-15 ENCOUNTER — Encounter: Payer: Self-pay | Admitting: Certified Nurse Midwife

## 2018-12-21 DIAGNOSIS — Z20828 Contact with and (suspected) exposure to other viral communicable diseases: Secondary | ICD-10-CM | POA: Diagnosis not present

## 2019-02-05 DIAGNOSIS — Z20828 Contact with and (suspected) exposure to other viral communicable diseases: Secondary | ICD-10-CM | POA: Diagnosis not present

## 2019-03-13 ENCOUNTER — Ambulatory Visit
Admission: RE | Admit: 2019-03-13 | Discharge: 2019-03-13 | Disposition: A | Discharge status: Home / Self Care | Payer: Managed Care, Other (non HMO) | Source: Ambulatory Visit | Attending: Certified Nurse Midwife | Admitting: Certified Nurse Midwife

## 2019-03-13 DIAGNOSIS — Z1231 Encounter for screening mammogram for malignant neoplasm of breast: Secondary | ICD-10-CM | POA: Insufficient documentation

## 2019-03-13 DIAGNOSIS — Z803 Family history of malignant neoplasm of breast: Secondary | ICD-10-CM | POA: Insufficient documentation

## 2019-03-14 ENCOUNTER — Other Ambulatory Visit: Payer: Self-pay | Admitting: Certified Nurse Midwife

## 2019-03-14 DIAGNOSIS — R928 Other abnormal and inconclusive findings on diagnostic imaging of breast: Secondary | ICD-10-CM

## 2019-03-14 DIAGNOSIS — N6489 Other specified disorders of breast: Secondary | ICD-10-CM

## 2019-03-20 DIAGNOSIS — Z1211 Encounter for screening for malignant neoplasm of colon: Secondary | ICD-10-CM | POA: Diagnosis not present

## 2019-03-20 DIAGNOSIS — Z01818 Encounter for other preprocedural examination: Secondary | ICD-10-CM | POA: Diagnosis not present

## 2019-03-26 ENCOUNTER — Ambulatory Visit
Admission: RE | Admit: 2019-03-26 | Discharge: 2019-03-26 | Disposition: A | Discharge status: Home / Self Care | Payer: Managed Care, Other (non HMO) | Source: Ambulatory Visit | Attending: Certified Nurse Midwife | Admitting: Certified Nurse Midwife

## 2019-03-26 DIAGNOSIS — R928 Other abnormal and inconclusive findings on diagnostic imaging of breast: Secondary | ICD-10-CM

## 2019-03-26 DIAGNOSIS — N6489 Other specified disorders of breast: Secondary | ICD-10-CM | POA: Diagnosis not present

## 2019-03-26 DIAGNOSIS — R922 Inconclusive mammogram: Secondary | ICD-10-CM | POA: Diagnosis not present

## 2019-05-05 ENCOUNTER — Ambulatory Visit: Payer: Self-pay | Attending: Internal Medicine

## 2019-05-05 ENCOUNTER — Other Ambulatory Visit: Payer: Self-pay

## 2019-05-05 DIAGNOSIS — Z23 Encounter for immunization: Secondary | ICD-10-CM | POA: Insufficient documentation

## 2019-05-05 NOTE — Progress Notes (Signed)
   Covid-19 Vaccination Clinic  Name:  Sierra Gonzalez    MRN: [id number] DOB: August 23, 1955  05/05/2019  [name] was observed post Covid-19 immunization for 15 minutes without incidence. She was provided with Vaccine Information Sheet and instruction to access the V-Safe system.   [name] was instructed to call 911 with any severe reactions post vaccine: Marland Kitchen Difficulty breathing  . Swelling of your face and throat  . A fast heartbeat  . A bad rash all over your body  . Dizziness and weakness    Immunizations Administered    Name Date Dose VIS Date Route   Moderna COVID-19 Vaccine 05/05/2019  1:26 PM 0.5 mL 02/06/2019 Intramuscular   Manufacturer: Moderna   Lot: [id number]   NDC: 83014-159-73

## 2019-06-02 ENCOUNTER — Ambulatory Visit: Payer: Self-pay | Attending: Internal Medicine

## 2019-06-02 DIAGNOSIS — Z23 Encounter for immunization: Secondary | ICD-10-CM

## 2019-06-02 NOTE — Progress Notes (Signed)
   Covid-19 Vaccination Clinic  Name:  Sierra Gonzalez    MRN: [id number] DOB: 14-Sep-1955  06/02/2019  [name] was observed post Covid-19 immunization for 15 minutes without incident. She was provided with Vaccine Information Sheet and instruction to access the V-Safe system.   [name] was instructed to call 911 with any severe reactions post vaccine: Marland Kitchen Difficulty breathing  . Swelling of face and throat  . A fast heartbeat  . A bad rash all over body  . Dizziness and weakness   Immunizations Administered    Name Date Dose VIS Date Route   Moderna COVID-19 Vaccine 06/02/2019  1:10 PM 0.5 mL 02/06/2019 Intramuscular   Manufacturer: Gala Murdoch   Lot: [id number]   NDC: 46803-212-24

## 2019-08-27 ENCOUNTER — Other Ambulatory Visit: Payer: Self-pay

## 2019-08-27 DIAGNOSIS — I10 Essential (primary) hypertension: Secondary | ICD-10-CM

## 2019-08-27 MED ORDER — LISINOPRIL-HYDROCHLOROTHIAZIDE 10-12.5 MG PO TABS: 1.0000 | ORAL_TABLET | Freq: Every day | ORAL | 0 refills | Status: DC

## 2019-08-31 NOTE — Progress Notes (Signed)
Scheduled to complete physical 09/11/19 - Provider TBD.  AMD

## 2019-09-03 ENCOUNTER — Ambulatory Visit: Payer: Self-pay

## 2019-09-03 ENCOUNTER — Other Ambulatory Visit: Payer: Self-pay

## 2019-09-03 DIAGNOSIS — Z0000 Encounter for general adult medical examination without abnormal findings: Secondary | ICD-10-CM

## 2019-09-03 LAB — POCT URINALYSIS DIPSTICK
Bilirubin, UA: NEGATIVE
Glucose, UA: NEGATIVE
Ketones, UA: NEGATIVE
Leukocytes, UA: NEGATIVE
Nitrite, UA: NEGATIVE
Protein, UA: NEGATIVE
Spec Grav, UA: >=1.03 — AB (ref 1.010–1.025)
Urobilinogen, UA: 0.2 E.U./dL
pH, UA: 5.5 (ref 5.0–8.0)

## 2019-09-04 LAB — CMP12+LP+TP+TSH+6AC+CBC/D/PLT
ALT: 22 IU/L (ref 0–32)
AST: 8 IU/L (ref 0–40)
Albumin/Globulin Ratio: 1.7 (ref 1.2–2.2)
Albumin: 4.5 g/dL (ref 3.8–4.8)
Alkaline Phosphatase: 58 IU/L (ref 48–121)
BUN/Creatinine Ratio: 17 (ref 12–28)
BUN: 15 mg/dL (ref 8–27)
Basophils Absolute: 0 10*3/uL (ref 0.0–0.2)
Basos: 0 %
Bilirubin Total: 0.2 mg/dL (ref 0.0–1.2)
Calcium: 9.6 mg/dL (ref 8.7–10.3)
Chloride: 100 mmol/L (ref 96–106)
Chol/HDL Ratio: 2.8 ratio (ref 0.0–4.4)
Cholesterol, Total: 155 mg/dL (ref 100–199)
Creatinine, Ser: 0.89 mg/dL (ref 0.57–1.00)
EOS (ABSOLUTE): 0.3 10*3/uL (ref 0.0–0.4)
Eos: 5 %
Estimated CHD Risk: <0.5 times avg. (ref 0.0–1.0)
Free Thyroxine Index: 1.4 (ref 1.2–4.9)
GFR calc Af Amer: 80 mL/min/{1.73_m2} (ref >59)
GFR calc non Af Amer: 70 mL/min/{1.73_m2} (ref >59)
GGT: 21 IU/L (ref 0–60)
Globulin, Total: 2.6 g/dL (ref 1.5–4.5)
Glucose: 152 mg/dL — ABNORMAL HIGH (ref 65–99)
HDL: 56 mg/dL (ref >39)
Hematocrit: 36.6 % (ref 34.0–46.6)
Hemoglobin: 12.1 g/dL (ref 11.1–15.9)
Immature Grans (Abs): 0 10*3/uL (ref 0.0–0.1)
Immature Granulocytes: 0 %
Iron: 86 ug/dL (ref 27–139)
LDH: 177 IU/L (ref 119–226)
LDL Chol Calc (NIH): 82 mg/dL (ref 0–99)
Lymphocytes Absolute: 2 10*3/uL (ref 0.7–3.1)
Lymphs: 41 %
MCH: 29.2 pg (ref 26.6–33.0)
MCHC: 33.1 g/dL (ref 31.5–35.7)
MCV: 88 fL (ref 79–97)
Monocytes Absolute: 0.5 10*3/uL (ref 0.1–0.9)
Monocytes: 10 %
Neutrophils Absolute: 2.1 10*3/uL (ref 1.4–7.0)
Neutrophils: 44 %
Phosphorus: 4.8 mg/dL — ABNORMAL HIGH (ref 3.0–4.3)
Platelets: 368 10*3/uL (ref 150–450)
Potassium: 4.6 mmol/L (ref 3.5–5.2)
RBC: 4.14 x10E6/uL (ref 3.77–5.28)
RDW: 13.1 % (ref 11.7–15.4)
Sodium: 139 mmol/L (ref 134–144)
T3 Uptake Ratio: 24 % (ref 24–39)
T4, Total: 5.8 ug/dL (ref 4.5–12.0)
TSH: 5.07 u[IU]/mL — ABNORMAL HIGH (ref 0.450–4.500)
Total Protein: 7.1 g/dL (ref 6.0–8.5)
Triglycerides: 90 mg/dL (ref 0–149)
Uric Acid: 5.5 mg/dL (ref 3.0–7.2)
VLDL Cholesterol Cal: 17 mg/dL (ref 5–40)
WBC: 4.8 10*3/uL (ref 3.4–10.8)

## 2019-09-19 ENCOUNTER — Ambulatory Visit: Payer: Self-pay | Admitting: Physician Assistant

## 2019-09-19 ENCOUNTER — Other Ambulatory Visit: Payer: Self-pay

## 2019-09-19 VITALS — BP 123/66 | HR 85 | Temp 98.2°F | Resp 14 | Ht 65.0 in | Wt 222.0 lb

## 2019-09-19 DIAGNOSIS — Z0000 Encounter for general adult medical examination without abnormal findings: Secondary | ICD-10-CM

## 2019-09-19 DIAGNOSIS — I10 Essential (primary) hypertension: Secondary | ICD-10-CM

## 2019-09-19 MED ORDER — LISINOPRIL-HYDROCHLOROTHIAZIDE 10-12.5 MG PO TABS: 1.0000 | ORAL_TABLET | Freq: Every day | ORAL | 0 refills | Status: DC

## 2019-09-19 NOTE — Progress Notes (Signed)
   Subjective:Annual physical exam    Patient ID: Sierra Gonzalez, female    DOB: 01-Jan-1956, 65 y.o.   MRN: [id number]  HPI Patient present for annual physical exam. No concern or compliant.   Review of Systems    Hypertension Objective:   Physical Exam BMI 36.94. NAD. HEENT unremarkable. Neck supple without adenopathy or bruits. Lungs CTA. Heart RRR. Abdomen with negative HSM, normoactive BS, soft, and non tender to palpation.  No obvious deformity to upper/lower extremities. Full and equal ROM of the upper/lower extremities. No obvious deformity to cervical of lumbar spine.  Full and equal ROM of cervical/lumbar spine.  CN II-XII grossly intact.       Assessment & Plan:Well exam  Discuss lab results and refll BP medication.  Follow up PRN.

## 2019-12-17 ENCOUNTER — Ambulatory Visit: Payer: 59 | Admitting: Obstetrics and Gynecology

## 2019-12-21 ENCOUNTER — Encounter: Payer: Self-pay | Admitting: Obstetrics and Gynecology

## 2019-12-21 ENCOUNTER — Ambulatory Visit (INDEPENDENT_AMBULATORY_CARE_PROVIDER_SITE_OTHER): Payer: 59 | Admitting: Obstetrics and Gynecology

## 2019-12-21 ENCOUNTER — Other Ambulatory Visit: Payer: Self-pay

## 2019-12-21 VITALS — BP 120/80 | Ht 64.0 in | Wt 222.0 lb

## 2019-12-21 DIAGNOSIS — Z01419 Encounter for gynecological examination (general) (routine) without abnormal findings: Secondary | ICD-10-CM | POA: Diagnosis not present

## 2019-12-21 DIAGNOSIS — Z1231 Encounter for screening mammogram for malignant neoplasm of breast: Secondary | ICD-10-CM | POA: Diagnosis not present

## 2019-12-21 DIAGNOSIS — Z1211 Encounter for screening for malignant neoplasm of colon: Secondary | ICD-10-CM

## 2019-12-21 DIAGNOSIS — Z803 Family history of malignant neoplasm of breast: Secondary | ICD-10-CM

## 2019-12-21 DIAGNOSIS — Z9189 Other specified personal risk factors, not elsewhere classified: Secondary | ICD-10-CM | POA: Diagnosis not present

## 2019-12-21 NOTE — Patient Instructions (Addendum)
I value your feedback and entrusting us with your care. If you get a [facility] patient survey, I would appreciate you taking the time to let us know about your experience today. Thank you! ° °As of February 15, 2019, your lab results will be released to your MyChart immediately, before I even have a chance to see them. Please give me time to review them and contact you if there are any abnormalities. Thank you for your patience.  ° °[facility]: [phone] ° ° ° °

## 2019-12-21 NOTE — Progress Notes (Signed)
PCP: No primary care provider on file.   Chief Complaint  Patient presents with  . Gynecologic Exam    HPI:      [name] is a 64 y.o. G2P2002 whose LMP was No LMP recorded. Patient is postmenopausal., presents today for her annual examination.  Her menses are absent due to menopause. No PMB. She does have vasomotor sx.   Sex activity: not sexually active. She does not have vaginal dryness.  Last Pap: 11/29/17  Results were: no abnormalities /neg HPV DNA 2018  Last mammogram: 03/26/19  Results were: normal--routine follow-up in 12 months There is no FH of breast cancer in her mother, sister, maternal aunt, and two paternal aunts. Genetic testing declined by pt several times. IBIS=27.1%. There is no FH of ovarian cancer. The patient does do self-breast exams.  Colonoscopy: 2010  Repeat due after 10 years. Tried to do it last yr but [facility] not in network for her insurance.   Tobacco use: The patient denies current or previous tobacco use. Alcohol use: none   No drug use Exercise: moderately active  She does get adequate calcium but not Vitamin D in her diet.  Labs with PCP/through city. Hx of HTN.    Past Medical History:  Diagnosis Date  . Allergic rhinitis   . Family history of breast cancer    9/18 genetic testing letter sent  . Fracture lumbar vertebra-closed (HCC) 04/21/2015   FELL DOWN STEPS-FX A BONE IN LOWER PART OF HER BACK;   . History of mammogram 10/21/14; 11/28/15   NEG; ADD VIEWS LEFT BREAST-BIRADS 3  . Hypertension   . Obesity (BMI 30-39.9)   . Umbilical hernia     Past Surgical History:  Procedure Laterality Date  . BACK SURGERY  age 63   had a bone inserted in upper spine  . CESAREAN SECTION     x2  . COLONOSCOPY  01/2009  . HYSTEROSCOPY WITH D & C  2003    Family History  Problem Relation Age of Onset  . Breast cancer Mother 42       premenopausal  . Breast cancer Sister 68  . Diabetes Sister   . Breast cancer Maternal Aunt 64  .  Breast cancer Paternal Aunt 71  . Colon cancer Maternal Grandmother 27  . Diabetes Father   . Hypertension Father   . Diabetes Mellitus II Brother   . Breast cancer Paternal Aunt 74    Social History   Socioeconomic History  . Marital status: Married    Spouse name: Winford  . Number of children: 2  . Years of education: 32  . Highest education level: Not on file  Occupational History  . 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 Smoking status: Never Smoker  . Smokeless tobacco: Never Used  Vaping Use  . Vaping Use: Never used  Substance and Sexual Activity  . Alcohol use: No  . Drug use: No  . Sexual activity: Not Currently    Partners: Male    Birth control/protection: Post-menopausal  Other Topics Concern  . Not on file  Social History Narrative  . Not on file   Social Determinants of Health   Financial Resource Strain:   . Difficulty of Paying Living Expenses: Not on file  Food Insecurity:   . Worried About Programme researcher, broadcasting/film/video in the Last Year: Not on file  . Ran Out of Food in the Last  Year: Not on file  Transportation Needs:   . Lack of Transportation (Medical): Not on file  . Lack of Transportation (Non-Medical): Not on file  Physical Activity:   . Days of Exercise per Week: Not on file  . Minutes of Exercise per Session: Not on file  Stress:   . Feeling of Stress : Not on file  Social Connections:   . Frequency of Communication with Friends and Family: Not on file  . Frequency of Social Gatherings with Friends and Family: Not on file  . Attends Religious Services: Not on file  . Active Member of Clubs or Organizations: Not on file  . Attends Banker Meetings: Not on file  . Marital Status: Not on file  Intimate Partner Violence:   . Fear of Current or Ex-Partner: Not on file  . Emotionally Abused: Not on file  . Physically Abused: Not on file  . Sexually Abused: Not on file     Current Outpatient  Medications:  .  aspirin 81 MG EC tablet, Take by mouth., Disp: , Rfl:  .  cetirizine (ZYRTEC) 5 MG tablet, Take 1 tablet by mouth daily., Disp: , Rfl:  .  lisinopril-hydrochlorothiazide (ZESTORETIC) 10-12.5 MG tablet, Take 1 tablet by mouth daily., Disp: 90 tablet, Rfl: 0     ROS:  Review of Systems  Constitutional: Negative for fatigue, fever and unexpected weight change.  Respiratory: Negative for cough, shortness of breath and wheezing.   Cardiovascular: Negative for chest pain, palpitations and leg swelling.  Gastrointestinal: Negative for blood in stool, constipation, diarrhea, nausea and vomiting.  Endocrine: Negative for cold intolerance, heat intolerance and polyuria.  Genitourinary: Negative for dyspareunia, dysuria, flank pain, frequency, genital sores, hematuria, menstrual problem, pelvic pain, urgency, vaginal bleeding, vaginal discharge and vaginal pain.  Musculoskeletal: Positive for arthralgias. Negative for back pain, joint swelling and myalgias.  Skin: Negative for rash.  Neurological: Negative for dizziness, syncope, light-headedness, numbness and headaches.  Hematological: Negative for adenopathy.  Psychiatric/Behavioral: Negative for agitation, confusion, sleep disturbance and suicidal ideas. The patient is not nervous/anxious.    BREAST: No symptoms    Objective: BP 120/80   Ht 5\' 4"  (1.626 m)   Wt 222 lb (100.7 kg)   BMI 38.11 kg/m    Physical Exam Constitutional:      Appearance: She is well-developed.  Genitourinary:     Vulva, vagina, cervix, uterus, right adnexa and left adnexa normal.     No vulval lesion or tenderness noted.     No vaginal discharge, erythema or tenderness.     No cervical polyp.     Uterus is not enlarged or tender.     No right or left adnexal mass present.     Right adnexa not tender.     Left adnexa not tender.  Neck:     Thyroid: No thyromegaly.  Cardiovascular:     Rate and Rhythm: Normal rate and regular rhythm.      Heart sounds: Normal heart sounds. No murmur heard.   Pulmonary:     Effort: Pulmonary effort is normal.     Breath sounds: Normal breath sounds.  Chest:     Breasts:        Right: No mass, nipple discharge, skin change or tenderness.        Left: No mass, nipple discharge, skin change or tenderness.  Abdominal:     Palpations: Abdomen is soft.     Tenderness: There is no abdominal tenderness. There is  no guarding.  Musculoskeletal:        General: Normal range of motion.     Cervical back: Normal range of motion.  Neurological:     General: No focal deficit present.     Mental Status: She is alert and oriented to person, place, and time.     Cranial Nerves: No cranial nerve deficit.  Skin:    General: Skin is warm and dry.  Psychiatric:        Mood and Affect: Mood normal.        Behavior: Behavior normal.        Thought Content: Thought content normal.        Judgment: Judgment normal.  Vitals reviewed.     Assessment/Plan:  Encounter for annual routine gynecological examination  Encounter for screening mammogram for malignant neoplasm of breast - Plan: MM 3D SCREEN BREAST BILATERAL; pt to sched mammo  Family history of breast cancer - Plan: MM 3D SCREEN BREAST BILATERAL; MyRisk testing discussed and handout given. Pt to f/u if desires.   Increased risk of breast cancer - Plan: MM 3D SCREEN BREAST BILATERAL; Recommended monthly SBE, yearly CBE and mammos, as well as scr breast MRI. Pt needs to do mammo 1/22, call for scr breast MRI ref prn. Add Vit D supp.  Screening for colon cancer--pt to call insurance co to find out GI MDs/facility in network. Will do ref based on her info          GYN counsel breast self exam, mammography screening, menopause, adequate intake of calcium and vitamin D, diet and exercise    F/U  Return in about 1 year (around 12/20/2020).  Cathaleen Korol B. Jerzy Crotteau, PA-C 12/21/2019 4:29 PM

## 2020-01-18 ENCOUNTER — Other Ambulatory Visit: Payer: Self-pay

## 2020-01-18 DIAGNOSIS — Z23 Encounter for immunization: Secondary | ICD-10-CM

## 2020-02-21 ENCOUNTER — Other Ambulatory Visit: Payer: Self-pay | Admitting: Physician Assistant

## 2020-02-21 DIAGNOSIS — I10 Essential (primary) hypertension: Secondary | ICD-10-CM

## 2020-03-04 ENCOUNTER — Telehealth: Payer: Self-pay

## 2020-03-04 DIAGNOSIS — H5203 Hypermetropia, bilateral: Secondary | ICD-10-CM | POA: Diagnosis not present

## 2020-03-04 NOTE — Telephone Encounter (Signed)
Rx refill request already in Epic - re-routed to Ron Smith, PA-C.  AMD 

## 2020-03-06 ENCOUNTER — Telehealth: Payer: Self-pay

## 2020-03-06 NOTE — Telephone Encounter (Signed)
Refill request already in Epic - rerouted to [employer] [employer].  Thought it was sent to Ron 03/04/20, but must have done incorrectly. Spouse Leanord Hawking) in the clinic today & said Pharmacy told [facility] they haven't received refill authorization from physician.  Checked the med hx & it has last refill receipt at pharmacy on 09/19/19 which is the date of Viktoria's last physical at our clinic.  AMD

## 2020-03-07 ENCOUNTER — Other Ambulatory Visit: Payer: Self-pay | Admitting: Physician Assistant

## 2020-03-07 DIAGNOSIS — I10 Essential (primary) hypertension: Secondary | ICD-10-CM

## 2020-03-17 ENCOUNTER — Other Ambulatory Visit: Payer: Self-pay

## 2020-03-17 ENCOUNTER — Ambulatory Visit
Admission: RE | Admit: 2020-03-17 | Discharge: 2020-03-17 | Disposition: A | Discharge status: Home / Self Care | Payer: 59 | Source: Ambulatory Visit | Attending: Obstetrics and Gynecology | Admitting: Obstetrics and Gynecology

## 2020-03-17 DIAGNOSIS — Z1231 Encounter for screening mammogram for malignant neoplasm of breast: Secondary | ICD-10-CM | POA: Diagnosis not present

## 2020-03-17 DIAGNOSIS — Z803 Family history of malignant neoplasm of breast: Secondary | ICD-10-CM | POA: Insufficient documentation

## 2020-03-17 DIAGNOSIS — Z9189 Other specified personal risk factors, not elsewhere classified: Secondary | ICD-10-CM

## 2020-12-24 ENCOUNTER — Other Ambulatory Visit: Payer: Self-pay

## 2020-12-24 ENCOUNTER — Ambulatory Visit (INDEPENDENT_AMBULATORY_CARE_PROVIDER_SITE_OTHER): Payer: 59 | Admitting: Obstetrics and Gynecology

## 2020-12-24 ENCOUNTER — Encounter: Payer: Self-pay | Admitting: Obstetrics and Gynecology

## 2020-12-24 ENCOUNTER — Other Ambulatory Visit (HOSPITAL_COMMUNITY)
Admission: RE | Admit: 2020-12-24 | Discharge: 2020-12-24 | Disposition: A | Discharge status: Home / Self Care | Payer: 59 | Source: Ambulatory Visit | Attending: Obstetrics and Gynecology | Admitting: Obstetrics and Gynecology

## 2020-12-24 VITALS — BP 150/80 | Ht 64.0 in | Wt 223.0 lb

## 2020-12-24 DIAGNOSIS — Z1231 Encounter for screening mammogram for malignant neoplasm of breast: Secondary | ICD-10-CM

## 2020-12-24 DIAGNOSIS — Z803 Family history of malignant neoplasm of breast: Secondary | ICD-10-CM | POA: Diagnosis not present

## 2020-12-24 DIAGNOSIS — R69 Illness, unspecified: Secondary | ICD-10-CM | POA: Diagnosis not present

## 2020-12-24 DIAGNOSIS — Z1151 Encounter for screening for human papillomavirus (HPV): Secondary | ICD-10-CM | POA: Insufficient documentation

## 2020-12-24 DIAGNOSIS — Z01419 Encounter for gynecological examination (general) (routine) without abnormal findings: Secondary | ICD-10-CM

## 2020-12-24 DIAGNOSIS — Z9189 Other specified personal risk factors, not elsewhere classified: Secondary | ICD-10-CM

## 2020-12-24 DIAGNOSIS — Z23 Encounter for immunization: Secondary | ICD-10-CM

## 2020-12-24 DIAGNOSIS — Z124 Encounter for screening for malignant neoplasm of cervix: Secondary | ICD-10-CM

## 2020-12-24 DIAGNOSIS — Z1211 Encounter for screening for malignant neoplasm of colon: Secondary | ICD-10-CM | POA: Diagnosis not present

## 2020-12-24 NOTE — Progress Notes (Signed)
PCP: Rica Records, PA-C   Chief Complaint  Patient presents with   Gynecologic Exam    No concerns    HPI:      [name] is a 65 y.o. G2P2002 whose LMP was No LMP recorded. Patient is postmenopausal., presents today for her annual examination.  Her menses are absent due to menopause. No PMB. She does have vasomotor sx.   Sex activity: not sexually active. She does not have vaginal dryness.  Last Pap: 11/29/17  Results were: no abnormalities /neg HPV DNA 2018  Last mammogram: 03/17/20  Results were: normal--routine follow-up in 12 months There is no FH of breast cancer in her mother, sister, maternal aunt, and two paternal aunts. Genetic testing declined by pt several times. IBIS=27.1%. There is no FH of ovarian cancer. The patient does do self-breast exams.  Colonoscopy: 2010  Repeat due after 10 years. Tried to do it last but [facility] not in network for her insurance. Doesn't know who is in network.   Tobacco use: The patient denies current or previous tobacco use. Alcohol use: none   No drug use Exercise: moderately active  She does get adequate calcium and Vitamin D in her diet.  Labs with PCP/through city. Hx of HTN.    Past Medical History:  Diagnosis Date   Allergic rhinitis    Family history of breast cancer    9/18 genetic testing letter sent   Fracture lumbar vertebra-closed (HCC) 04/21/2015   FELL DOWN STEPS-FX A BONE IN LOWER PART OF HER BACK;    History of mammogram 10/21/14; 11/28/15   NEG; ADD VIEWS LEFT BREAST-BIRADS 3   Hypertension    Obesity (BMI 30-39.9)    Umbilical hernia     Past Surgical History:  Procedure Laterality Date   BACK SURGERY  age 8   had a bone inserted in upper spine   CESAREAN SECTION     x2   COLONOSCOPY  01/2009   HYSTEROSCOPY WITH D & C  2003    Family History  Problem Relation Age of Onset   Breast cancer Mother 36       premenopausal   Diabetes Father    Hypertension Father    Kidney cancer Father     Breast cancer Sister 57   Diabetes Sister    Diabetes Mellitus II Brother    Breast cancer Maternal Aunt 10   Breast cancer Paternal Aunt 30   Breast cancer Paternal Aunt 79   Colon cancer Maternal Grandmother 25    Social History   Socioeconomic History   Marital status: Married    Spouse name: Winford   Number of children: 2   Years of education: 14   Highest education level: Not on file  Occupational History   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  Tobacco Use   Smoking status: Never   Smokeless tobacco: Never  Vaping Use   Vaping Use: Never used  Substance and Sexual Activity   Alcohol use: No   Drug use: No   Sexual activity: Not Currently    Partners: Male    Birth control/protection: Post-menopausal  Other Topics Concern   Not on file  Social History Narrative   Not on file   Social Determinants of Health   Financial Resource Strain: Not on file  Food Insecurity: Not on file  Transportation Needs: Not on file  Physical Activity: Not on file  Stress: Not on file  Social  Connections: Not on file  Intimate Partner Violence: Not on file     Current Outpatient Medications:    aspirin 81 MG EC tablet, Take by mouth., Disp: , Rfl:    cetirizine (ZYRTEC) 5 MG tablet, Take 1 tablet by mouth daily., Disp: , Rfl:    lisinopril-hydrochlorothiazide (ZESTORETIC) 10-12.5 MG tablet, Take 1 tablet by mouth once daily, Disp: 90 tablet, Rfl: 2     ROS:  Review of Systems  Constitutional:  Negative for fatigue, fever and unexpected weight change.  Respiratory:  Negative for cough, shortness of breath and wheezing.   Cardiovascular:  Negative for chest pain, palpitations and leg swelling.  Gastrointestinal:  Negative for blood in stool, constipation, diarrhea, nausea and vomiting.  Endocrine: Negative for cold intolerance, heat intolerance and polyuria.  Genitourinary:  Negative for dyspareunia, dysuria, flank pain, frequency, genital sores,  hematuria, menstrual problem, pelvic pain, urgency, vaginal bleeding, vaginal discharge and vaginal pain.  Musculoskeletal:  Negative for arthralgias, back pain, joint swelling and myalgias.  Skin:  Negative for rash.  Neurological:  Negative for dizziness, syncope, light-headedness, numbness and headaches.  Hematological:  Negative for adenopathy.  Psychiatric/Behavioral:  Negative for agitation, confusion, sleep disturbance and suicidal ideas. The patient is not nervous/anxious.   BREAST: No symptoms    Objective: BP (!) 150/80   Ht 5\' 4"  (1.626 m)   Wt 223 lb (101.2 kg)   BMI 38.28 kg/m    Physical Exam Constitutional:      Appearance: She is well-developed.  Genitourinary:     Vulva normal.     Right Labia: No rash, tenderness or lesions.    Left Labia: No tenderness, lesions or rash.    No vaginal discharge, erythema or tenderness.      Right Adnexa: not tender and no mass present.    Left Adnexa: not tender and no mass present.    No cervical friability or polyp.     Uterus is not enlarged or tender.  Breasts:    Right: No mass, nipple discharge, skin change or tenderness.     Left: No mass, nipple discharge, skin change or tenderness.  Neck:     Thyroid: No thyromegaly.  Cardiovascular:     Rate and Rhythm: Normal rate and regular rhythm.     Heart sounds: Normal heart sounds. No murmur heard. Pulmonary:     Effort: Pulmonary effort is normal.     Breath sounds: Normal breath sounds.  Abdominal:     Palpations: Abdomen is soft.     Tenderness: There is no abdominal tenderness. There is no guarding or rebound.  Musculoskeletal:        General: Normal range of motion.     Cervical back: Normal range of motion.  Lymphadenopathy:     Cervical: No cervical adenopathy.  Neurological:     General: No focal deficit present.     Mental Status: She is alert and oriented to person, place, and time.     Cranial Nerves: No cranial nerve deficit.  Skin:    General:  Skin is warm and dry.  Psychiatric:        Mood and Affect: Mood normal.        Behavior: Behavior normal.        Thought Content: Thought content normal.        Judgment: Judgment normal.  Vitals reviewed.    Assessment/Plan:  Encounter for annual routine gynecological examination  Cervical cancer screening - Plan: Cytology - PAP  Screening  for HPV (human papillomavirus) - Plan: Cytology - PAP  Encounter for screening mammogram for malignant neoplasm of breast - Plan: MM 3D SCREEN BREAST BILATERAL; pt to sched mammo  Family history of breast cancer - Plan: MM 3D SCREEN BREAST BILATERAL; MyRisk testing discussed and pt declines.   Increased risk of breast cancer - Plan: MM 3D SCREEN BREAST BILATERAL; Pt aware of monthly SBE, yearly CBE and mammos, as well as scr breast MRI. Cont Vit D supp.  Screening for colon cancer--pt to call insurance co to find out GI MDs/facility in network. Will do ref based on her info          GYN counsel breast self exam, mammography screening, menopause, adequate intake of calcium and vitamin D, diet and exercise    F/U  Return in about 1 year (around 12/24/2021).  Denisa Enterline B. Daylyn Azbill, PA-C 12/24/2020 4:10 PM

## 2020-12-24 NOTE — Patient Instructions (Signed)
I value your feedback and you entrusting us with your care. If you get a [facility] patient survey, I would appreciate you taking the time to let us know about your experience today. Thank you!  [facility]: [phone]      

## 2020-12-26 LAB — CYTOLOGY - PAP
Comment: NEGATIVE
Diagnosis: NEGATIVE
High risk HPV: NEGATIVE

## 2021-01-04 ENCOUNTER — Other Ambulatory Visit: Payer: Self-pay | Admitting: Physician Assistant

## 2021-01-04 DIAGNOSIS — I10 Essential (primary) hypertension: Secondary | ICD-10-CM

## 2021-01-12 ENCOUNTER — Other Ambulatory Visit: Payer: Self-pay

## 2021-01-12 NOTE — Telephone Encounter (Signed)
Lundynn called clinic requesting Rx refill for Lisinopril-HCTZ & states she's out of meds.  When I went to put Rx refill information into Epic, a box opened up indicating there was a pended request sent electronically by patient's pharmacy to [facility] ED.  Re-routed the pended Rx refill request to Nona Dell, PA-C.  AMD

## 2021-01-16 ENCOUNTER — Ambulatory Visit: Payer: 59

## 2021-01-16 ENCOUNTER — Other Ambulatory Visit: Payer: Self-pay

## 2021-01-16 DIAGNOSIS — Z0000 Encounter for general adult medical examination without abnormal findings: Secondary | ICD-10-CM

## 2021-01-16 DIAGNOSIS — R7309 Other abnormal glucose: Secondary | ICD-10-CM

## 2021-01-16 LAB — POCT URINALYSIS DIPSTICK
Bilirubin, UA: NEGATIVE
Glucose, UA: NEGATIVE
Ketones, UA: NEGATIVE
Leukocytes, UA: NEGATIVE
Nitrite, UA: NEGATIVE
Protein, UA: POSITIVE — AB
Spec Grav, UA: 1.025 (ref 1.010–1.025)
Urobilinogen, UA: 0.2 E.U./dL
pH, UA: 5.5 (ref 5.0–8.0)

## 2021-01-16 NOTE — Progress Notes (Signed)
01/22/21 annual physical scheduled. Ekg needs to be done at apt.

## 2021-01-17 LAB — CMP12+LP+TP+TSH+6AC+CBC/D/PLT
ALT: 19 IU/L (ref 0–32)
AST: 9 IU/L (ref 0–40)
Albumin/Globulin Ratio: 1.7 (ref 1.2–2.2)
Albumin: 4.4 g/dL (ref 3.8–4.8)
Alkaline Phosphatase: 65 IU/L (ref 44–121)
BUN/Creatinine Ratio: 11 — ABNORMAL LOW (ref 12–28)
BUN: 9 mg/dL (ref 8–27)
Basophils Absolute: 0 10*3/uL (ref 0.0–0.2)
Basos: 0 %
Bilirubin Total: <0.2 mg/dL (ref 0.0–1.2)
Calcium: 9.6 mg/dL (ref 8.7–10.3)
Chloride: 104 mmol/L (ref 96–106)
Chol/HDL Ratio: 2.8 ratio (ref 0.0–4.4)
Cholesterol, Total: 125 mg/dL (ref 100–199)
Creatinine, Ser: 0.83 mg/dL (ref 0.57–1.00)
EOS (ABSOLUTE): 0.2 10*3/uL (ref 0.0–0.4)
Eos: 5 %
Estimated CHD Risk: <0.5 times avg. (ref 0.0–1.0)
Free Thyroxine Index: 1.7 (ref 1.2–4.9)
GGT: 15 IU/L (ref 0–60)
Globulin, Total: 2.6 g/dL (ref 1.5–4.5)
Glucose: 119 mg/dL — ABNORMAL HIGH (ref 70–99)
HDL: 45 mg/dL (ref >39)
Hematocrit: 36 % (ref 34.0–46.6)
Hemoglobin: 12.3 g/dL (ref 11.1–15.9)
Immature Grans (Abs): 0 10*3/uL (ref 0.0–0.1)
Immature Granulocytes: 0 %
Iron: 52 ug/dL (ref 27–139)
LDH: 162 IU/L (ref 119–226)
LDL Chol Calc (NIH): 64 mg/dL (ref 0–99)
Lymphocytes Absolute: 2.1 10*3/uL (ref 0.7–3.1)
Lymphs: 41 %
MCH: 29.6 pg (ref 26.6–33.0)
MCHC: 34.2 g/dL (ref 31.5–35.7)
MCV: 87 fL (ref 79–97)
Monocytes Absolute: 0.4 10*3/uL (ref 0.1–0.9)
Monocytes: 8 %
Neutrophils Absolute: 2.4 10*3/uL (ref 1.4–7.0)
Neutrophils: 46 %
Phosphorus: 4.6 mg/dL — ABNORMAL HIGH (ref 3.0–4.3)
Platelets: 400 10*3/uL (ref 150–450)
Potassium: 4.3 mmol/L (ref 3.5–5.2)
RBC: 4.16 x10E6/uL (ref 3.77–5.28)
RDW: 12.7 % (ref 11.7–15.4)
Sodium: 143 mmol/L (ref 134–144)
T3 Uptake Ratio: 24 % (ref 24–39)
T4, Total: 6.9 ug/dL (ref 4.5–12.0)
TSH: 4.39 u[IU]/mL (ref 0.450–4.500)
Total Protein: 7 g/dL (ref 6.0–8.5)
Triglycerides: 82 mg/dL (ref 0–149)
Uric Acid: 5.5 mg/dL (ref 3.0–7.2)
VLDL Cholesterol Cal: 16 mg/dL (ref 5–40)
WBC: 5.2 10*3/uL (ref 3.4–10.8)
eGFR: 79 mL/min/{1.73_m2} (ref >59)

## 2021-01-17 LAB — HGB A1C W/O EAG: Hgb A1c MFr Bld: 7.3 % — ABNORMAL HIGH (ref 4.8–5.6)

## 2021-01-22 ENCOUNTER — Ambulatory Visit: Payer: Self-pay | Admitting: Physician Assistant

## 2021-01-22 ENCOUNTER — Other Ambulatory Visit (HOSPITAL_BASED_OUTPATIENT_CLINIC_OR_DEPARTMENT_OTHER): Payer: Self-pay

## 2021-01-22 ENCOUNTER — Encounter: Payer: Self-pay | Admitting: Physician Assistant

## 2021-01-22 ENCOUNTER — Other Ambulatory Visit: Payer: Self-pay

## 2021-01-22 VITALS — BP 144/78 | HR 98 | Temp 97.5°F | Resp 20 | Ht 65.0 in | Wt 221.0 lb

## 2021-01-22 DIAGNOSIS — E0800 Diabetes mellitus due to underlying condition with hyperosmolarity without nonketotic hyperglycemic-hyperosmolar coma (NKHHC): Secondary | ICD-10-CM

## 2021-01-22 DIAGNOSIS — Z0000 Encounter for general adult medical examination without abnormal findings: Secondary | ICD-10-CM

## 2021-01-22 MED ORDER — PFIZER COVID-19 VAC BIVALENT 30 MCG/0.3ML IM SUSP
INTRAMUSCULAR | 0 refills | Status: DC
  Filled 2021-01-22: qty 0.3, 1d supply, fill #0

## 2021-01-22 MED ORDER — METFORMIN HCL 1000 MG PO TABS: 1000.0000 mg | ORAL_TABLET | Freq: Two times a day (BID) | ORAL | 3 refills | Status: DC

## 2021-01-22 NOTE — Progress Notes (Signed)
   Subjective: Annual physical exam    Patient ID: Sierra Gonzalez, female    DOB: 1956/02/07, 65 y.o.   MRN: [id number]  HPI Patient presents for annual physical exam.  Patient reports no concerns or complaints.   Review of Systems Hypertension allergic rhinitis allergic rhinitis and hypertension.    Objective:   Physical Exam  No acute distress.  Temperature 97.5, pulse 98, respiration 12, BP is 144/78, and O2 sat is 96% on room air.  Patient weighs 2.21 pounds.  BMI is 36.78 HEENT is unremarkable.  Neck was supple without lymphadenopathy or bruits.  Lungs are clear to auscultation.  Heart is regular rate and rhythm.  No no change or acute findings on EKG. Abdomen is distended secondary to body habitus, normoactive bowel sounds, soft, nontender to palpation. No obvious deformity of the upper or lower extremities.  Patient has full and equal range of motion of the upper and lower extremities. No obvious cervical or lumbar spine deformity.  Patient has full and equal range of motion cervical lumbar spine. Cranial nerves II through XII are grossly intact.  DTRs are 2+ without clonus.    Assessment & Plan: Well exam   Discussed lab results with patient showing hemoglobin A1c of 7.3.  Patient is amendable to starting metformin at 1000 mg twice daily.  Patient will follow-up in 3 months for hemoglobin A1c.

## 2021-03-04 DIAGNOSIS — H25013 Cortical age-related cataract, bilateral: Secondary | ICD-10-CM | POA: Diagnosis not present

## 2021-03-04 DIAGNOSIS — H5203 Hypermetropia, bilateral: Secondary | ICD-10-CM | POA: Diagnosis not present

## 2021-03-04 DIAGNOSIS — E119 Type 2 diabetes mellitus without complications: Secondary | ICD-10-CM | POA: Diagnosis not present

## 2021-03-11 IMAGING — MG DIGITAL SCREENING BILAT W/ TOMO W/ CAD
6 of 10 series · 6 of 30 positions shown · non-contrast
Comparison: Previous exam(s).

CLINICAL DATA: Screening.

EXAM:
DIGITAL SCREENING BILATERAL MAMMOGRAM WITH TOMO AND CAD

[L MLO synth-2D (1 of 2)]
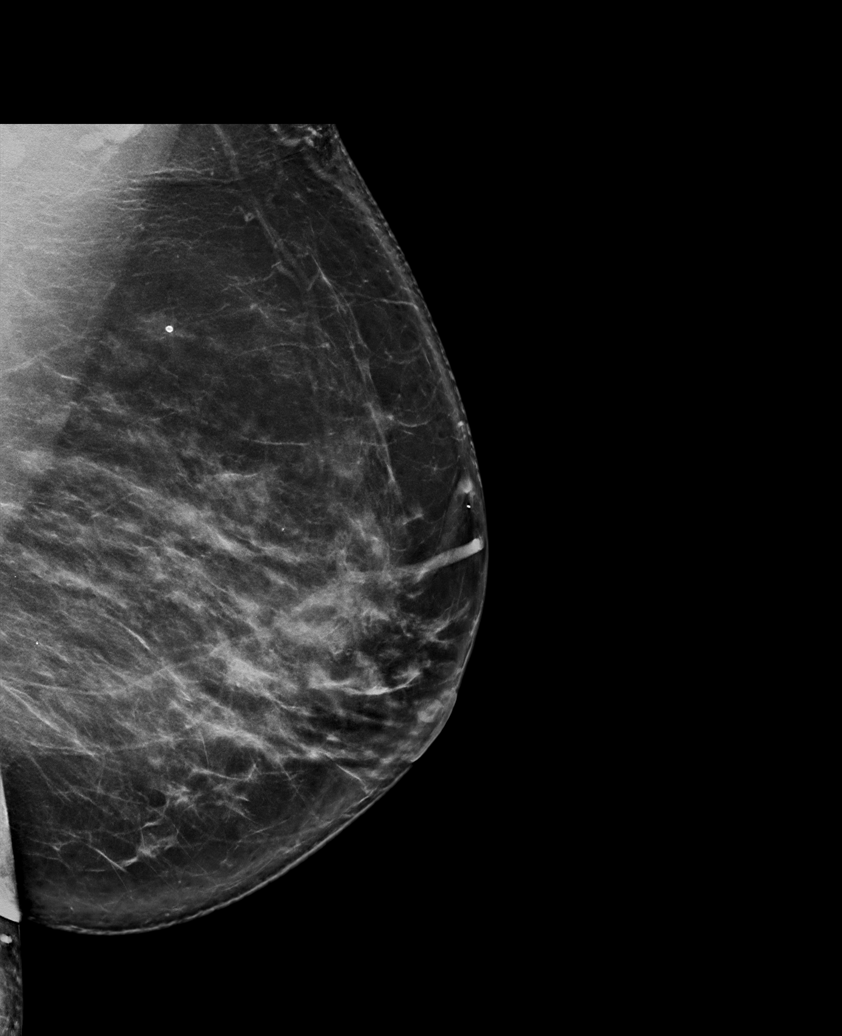

[R MLO synth-2D]
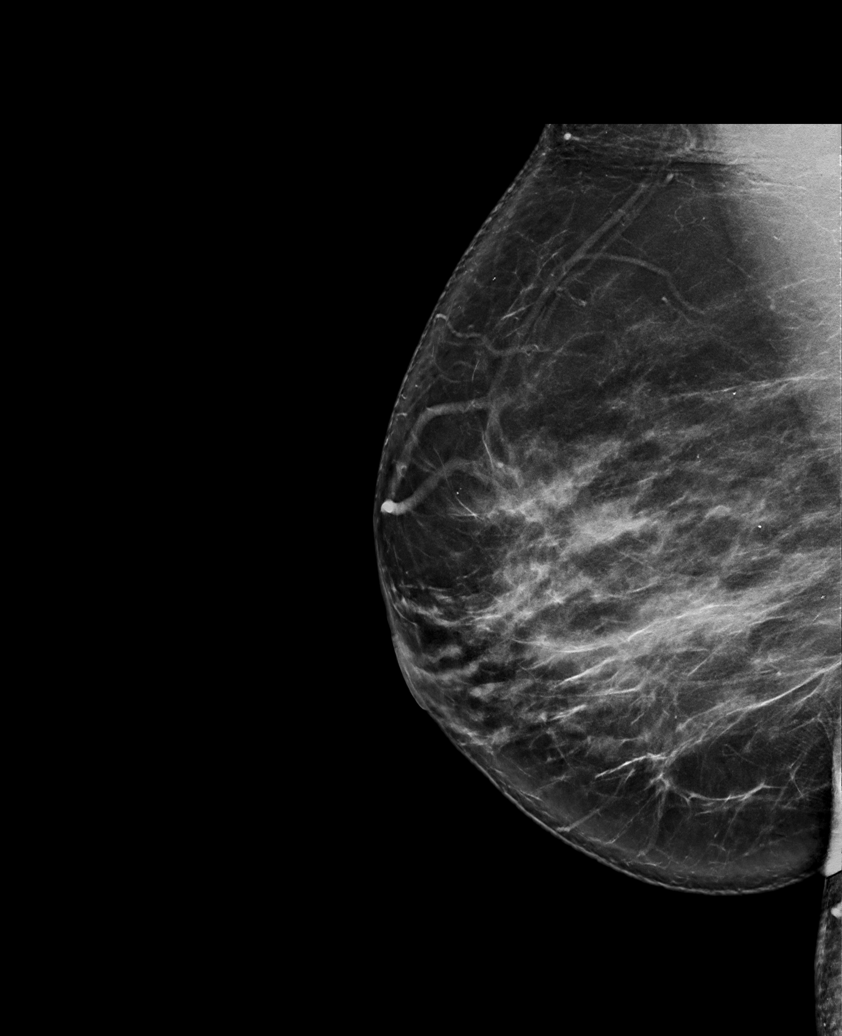

[L CC synth-2D]
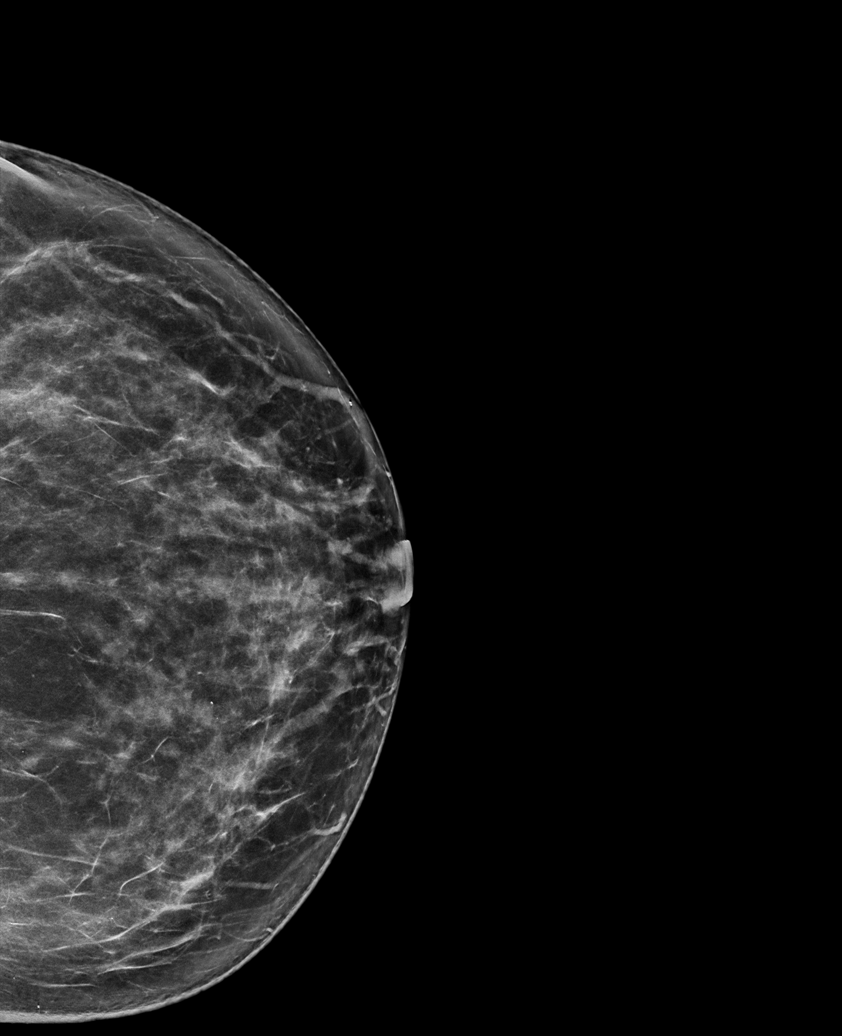

[R CC synth-2D]
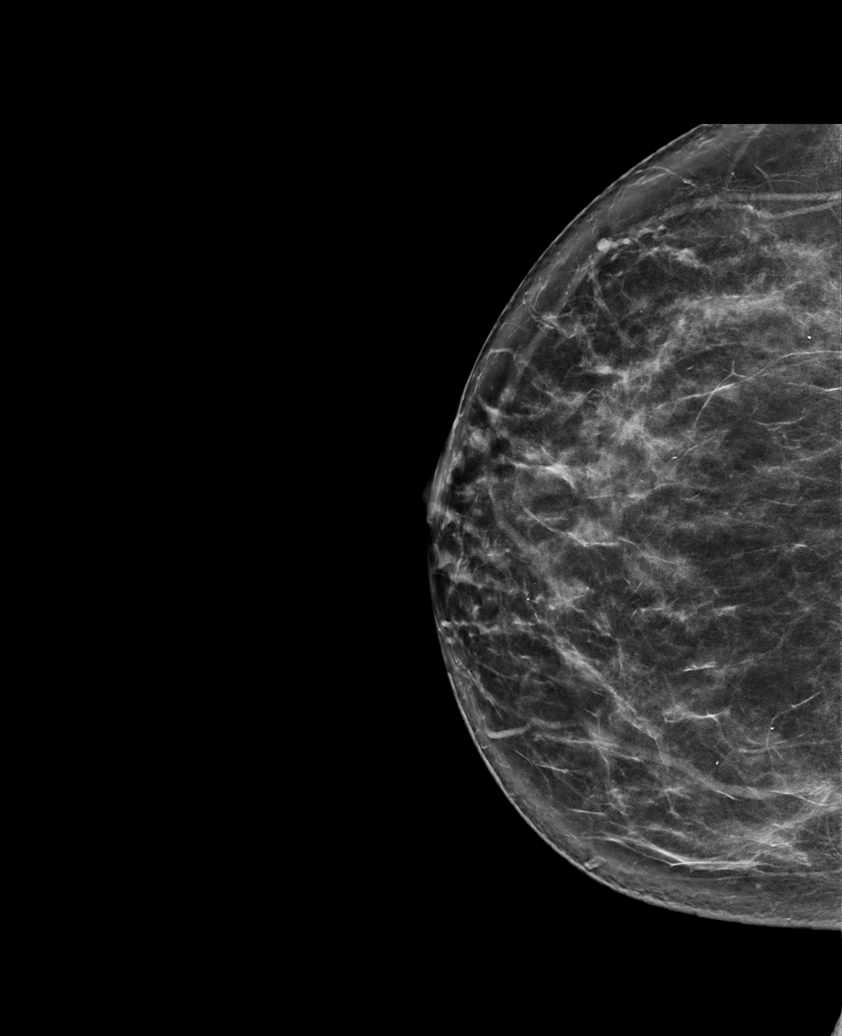

[L MLO synth-2D (2 of 2)]
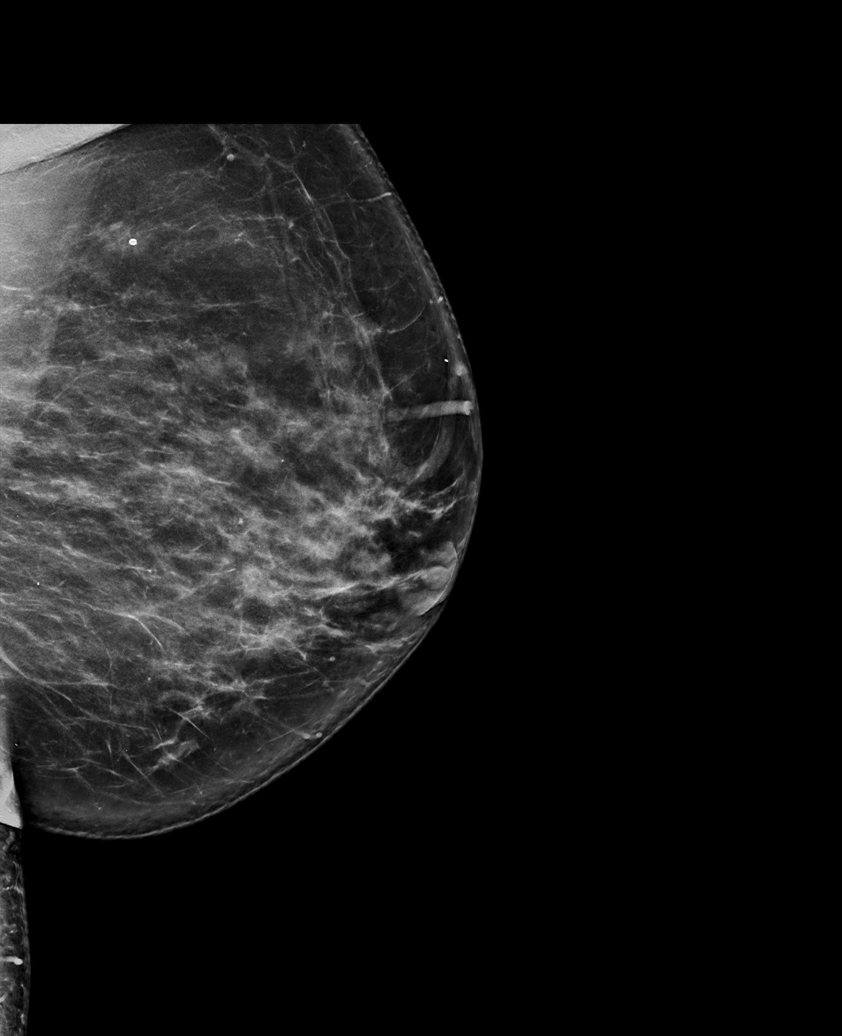

[L MLO tomo · tomo slice 49/97.0]
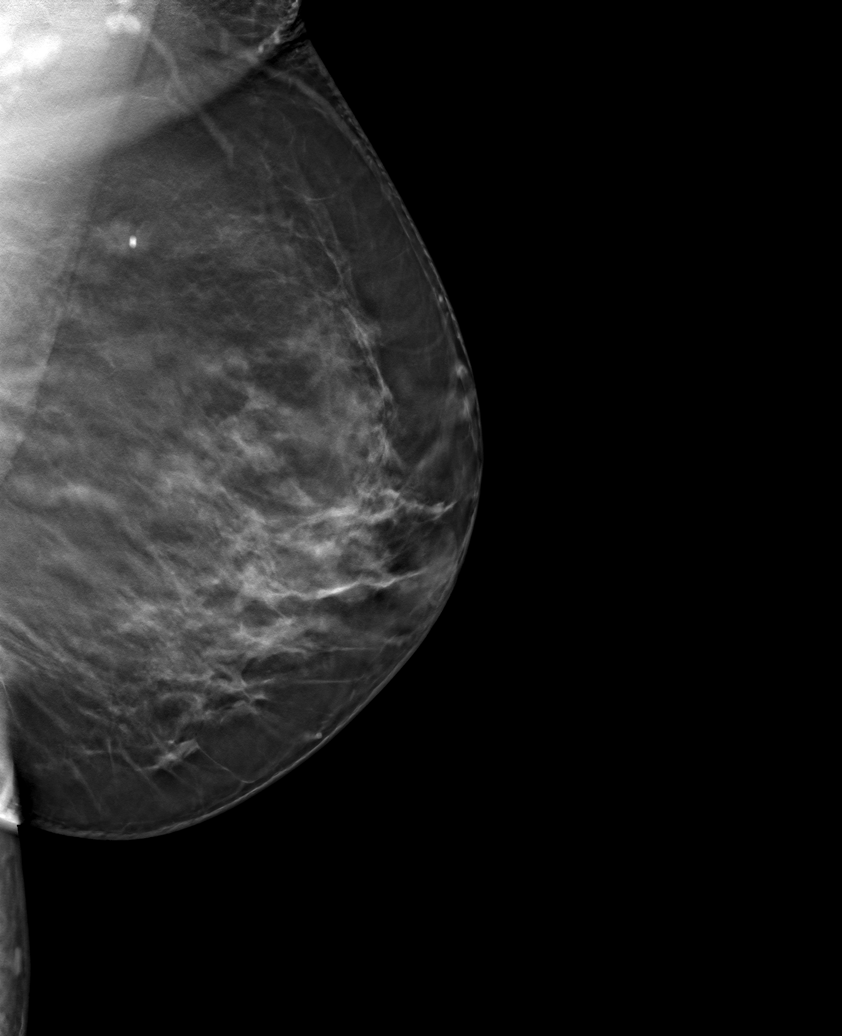

[6 of 30 positions shown; findings below may reference images not displayed]

ACR Breast Density Category c: The breast tissue is heterogeneously
dense, which may obscure small masses.
FINDINGS: In the right breast, a possible asymmetry warrants further
evaluation. In the left breast, no findings suspicious for
malignancy. Images were processed with CAD.
IMPRESSION: Further evaluation is suggested for possible asymmetry in the right
breast.

RECOMMENDATION:
1. Diagnostic mammogram and possibly ultrasound of the right breast.
(Code:LI-1-CC7)

2. Consider genetics assessment to determine the patient's lifetime
risk of breast cancer given her family history, if this has not
already been performed. Per American Cancer Society guidelines, if
the patient has a calculated lifetime risk of developing breast
cancer of greater than 20%, annual screening MRI of the breasts
would be recommended at the time of screening mammography.

The patient will be contacted regarding the findings, and additional
imaging will be scheduled.

BI-RADS CATEGORY  0: Incomplete. Need additional imaging evaluation
and/or prior mammograms for comparison.

## 2021-03-18 ENCOUNTER — Ambulatory Visit
Admission: RE | Admit: 2021-03-18 | Discharge: 2021-03-18 | Disposition: A | Discharge status: Home / Self Care | Payer: 59 | Source: Ambulatory Visit | Attending: Obstetrics and Gynecology | Admitting: Obstetrics and Gynecology

## 2021-03-18 ENCOUNTER — Other Ambulatory Visit: Payer: Self-pay

## 2021-03-18 DIAGNOSIS — Z1231 Encounter for screening mammogram for malignant neoplasm of breast: Secondary | ICD-10-CM | POA: Insufficient documentation

## 2021-03-18 DIAGNOSIS — Z803 Family history of malignant neoplasm of breast: Secondary | ICD-10-CM | POA: Diagnosis not present

## 2021-04-16 DIAGNOSIS — U071 COVID-19: Secondary | ICD-10-CM | POA: Diagnosis not present

## 2021-04-16 DIAGNOSIS — R509 Fever, unspecified: Secondary | ICD-10-CM | POA: Diagnosis not present

## 2021-04-16 DIAGNOSIS — Z03818 Encounter for observation for suspected exposure to other biological agents ruled out: Secondary | ICD-10-CM | POA: Diagnosis not present

## 2021-12-28 NOTE — Progress Notes (Signed)
PCP: Rica Records, PA-C   Chief Complaint  Patient presents with   Gynecologic Exam    HPI:      [name] is a 66 y.o. [phone] whose LMP was No LMP recorded. Patient is postmenopausal., presents today for her annual examination.  Her menses are absent due to menopause. No PMB. She does have occas vasomotor sx.   Sex activity: not sexually active. She does not have vaginal dryness. No vag sx..  Last Pap: 12/24/20   Results were: no abnormalities /neg HPV DNA   Last mammogram: 03/18/21  Results were: normal--routine follow-up in 12 months There is no FH of breast cancer in her mother, sister, maternal aunt, and two paternal aunts. Genetic testing declined by pt several times. IBIS=27.1%. There is no FH of ovarian cancer. The patient does self-breast exams.  Colonoscopy: 2010  Repeat due after 10 years. Tried to do it last time but [facility] not in network for her insurance. Doesn't know who is in network/will be on Medicare 12/23.   Tobacco use: The patient denies current or previous tobacco use. Alcohol use: none   No drug use Exercise: moderately active  She does get adequate calcium and Vitamin D in her diet.  Labs with PCP/through city. Hx of HTN.   Pt with chronic LT hip pain for over a yr. Sx bothersome when getting ready to go to bed at night. Not bothersome in day. Feels like it's in joint. No imaging done.    Past Medical History:  Diagnosis Date   Allergic rhinitis    Family history of breast cancer    9/18 genetic testing letter sent   Fracture lumbar vertebra-closed ([facility]) 04/21/2015   FELL DOWN STEPS-FX A BONE IN LOWER PART OF HER BACK;    History of mammogram 10/21/14; 11/28/15   NEG; ADD VIEWS LEFT BREAST-BIRADS 3   Hypertension    Obesity (BMI 30-39.9)    Umbilical hernia     Past Surgical History:  Procedure Laterality Date   BACK SURGERY  age 45   had a bone inserted in upper spine   CESAREAN SECTION     x2   COLONOSCOPY  01/2009    HYSTEROSCOPY WITH D & C  2003    Family History  Problem Relation Age of Onset   Breast cancer Mother 85       premenopausal   Diabetes Father    Hypertension Father    Kidney cancer Father    Breast cancer Sister 49   Diabetes Sister    Diabetes Mellitus II Brother    Breast cancer Maternal Aunt 78   Breast cancer Paternal Aunt 65   Breast cancer Paternal Aunt 16   Colon cancer Maternal Grandmother 58    Social History   Socioeconomic History   Marital status: Married    Spouse name: Winford   Number of children: 2   Years of education: 14   Highest education level: Not on file  Occupational History   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  Tobacco Use   Smoking status: Never   Smokeless tobacco: Never  Vaping Use   Vaping Use: Never used  Substance and Sexual Activity   Alcohol use: No   Drug use: No   Sexual activity: Not Currently    Partners: Male    Birth control/protection: Post-menopausal  Other Topics Concern   Not on file  Social History Narrative   Not on file  Social Determinants of Health   Financial Resource Strain: Not on file  Food Insecurity: Not on file  Transportation Needs: Not on file  Physical Activity: Not on file  Stress: Not on file  Social Connections: Not on file  Intimate Partner Violence: Not on file     Current Outpatient Medications:    aspirin 81 MG EC tablet, Take by mouth., Disp: , Rfl:    cetirizine (ZYRTEC) 5 MG tablet, Take 1 tablet by mouth daily., Disp: , Rfl:    Cholecalciferol (VITAMIN D3 PO), Take 2,000 mg by mouth daily., Disp: , Rfl:    lisinopril-hydrochlorothiazide (ZESTORETIC) 10-12.5 MG tablet, Take 1 tablet by mouth daily., Disp: 90 tablet, Rfl: 3   metFORMIN (GLUCOPHAGE) 1000 MG tablet, Take 1 tablet (1,000 mg total) by mouth 2 (two) times daily with a meal., Disp: 180 tablet, Rfl: 3     ROS:  Review of Systems  Constitutional:  Negative for fatigue, fever and unexpected weight  change.  Respiratory:  Negative for cough, shortness of breath and wheezing.   Cardiovascular:  Negative for chest pain, palpitations and leg swelling.  Gastrointestinal:  Negative for blood in stool, constipation, diarrhea, nausea and vomiting.  Endocrine: Negative for cold intolerance, heat intolerance and polyuria.  Genitourinary:  Negative for dyspareunia, dysuria, flank pain, frequency, genital sores, hematuria, menstrual problem, pelvic pain, urgency, vaginal bleeding, vaginal discharge and vaginal pain.  Musculoskeletal:  Positive for arthralgias. Negative for back pain, joint swelling and myalgias.  Skin:  Negative for rash.  Neurological:  Negative for dizziness, syncope, light-headedness, numbness and headaches.  Hematological:  Negative for adenopathy.  Psychiatric/Behavioral:  Negative for agitation, confusion, sleep disturbance and suicidal ideas. The patient is not nervous/anxious.    BREAST: No symptoms    Objective: BP (!) 140/78   Ht 5\' 4"  (1.626 m)   Wt 213 lb (96.6 kg)   BMI 36.56 kg/m    Physical Exam Constitutional:      Appearance: She is well-developed.  Genitourinary:     Vulva normal.     Right Labia: No rash, tenderness or lesions.    Left Labia: No tenderness, lesions or rash.    No vaginal discharge, erythema or tenderness.      Right Adnexa: not tender and no mass present.    Left Adnexa: not tender and no mass present.    No cervical friability or polyp.     Uterus is not enlarged or tender.  Breasts:    Right: No mass, nipple discharge, skin change or tenderness.     Left: No mass, nipple discharge, skin change or tenderness.  Neck:     Thyroid: No thyromegaly.  Cardiovascular:     Rate and Rhythm: Normal rate and regular rhythm.     Heart sounds: Normal heart sounds. No murmur heard. Pulmonary:     Effort: Pulmonary effort is normal.     Breath sounds: Normal breath sounds.  Abdominal:     Palpations: Abdomen is soft.     Tenderness:  There is no abdominal tenderness. There is no guarding or rebound.  Musculoskeletal:        General: Normal range of motion.     Cervical back: Normal range of motion.  Lymphadenopathy:     Cervical: No cervical adenopathy.  Neurological:     General: No focal deficit present.     Mental Status: She is alert and oriented to person, place, and time.     Cranial Nerves: No cranial nerve deficit.  Skin:    General: Skin is warm and dry.  Psychiatric:        Mood and Affect: Mood normal.        Behavior: Behavior normal.        Thought Content: Thought content normal.        Judgment: Judgment normal.  Vitals reviewed.     Assessment/Plan:  Encounter for annual routine gynecological examination  Encounter for screening mammogram for malignant neoplasm of breast - Plan: MM 3D SCREEN BREAST BILATERAL; pt to schedule mammo  Family history of breast cancer - Plan: MM 3D SCREEN BREAST BILATERAL; MyRisk testing discussed (need to do before going on Medicare); pt declines. F/u prn.   Increased risk of breast cancer - Plan: MM 3D SCREEN BREAST BILATERAL; cont monthly SBE, yearly mammos, Vit D supp  Screening for colon cancer - Plan: Ambulatory referral to Gastroenterology; refer to GI for colonoscopy in 1/24 once has Medicare due to [employer] with [employer].  Left hip pain--pt to f/u with PCP next mo for imaging. Suggested low back and pelvis/hip stretches in meantime.   Need for immunization against influenza - Plan: Flu Vaccine QUAD 75mo+IM (Fluarix, Fluzone & Alfiuria Quad PF)          GYN counsel breast self exam, mammography screening, menopause, adequate intake of calcium and vitamin D, diet and exercise    F/U  Return in about 1 year (around 12/30/2022).  Kenyata Guess B. Mikaele Stecher, PA-C 12/29/2021 4:41 PM

## 2021-12-29 ENCOUNTER — Ambulatory Visit (INDEPENDENT_AMBULATORY_CARE_PROVIDER_SITE_OTHER): Payer: 59 | Admitting: Obstetrics and Gynecology

## 2021-12-29 ENCOUNTER — Encounter: Payer: Self-pay | Admitting: Obstetrics and Gynecology

## 2021-12-29 VITALS — BP 140/78 | Ht 64.0 in | Wt 213.0 lb

## 2021-12-29 DIAGNOSIS — Z1211 Encounter for screening for malignant neoplasm of colon: Secondary | ICD-10-CM

## 2021-12-29 DIAGNOSIS — Z01419 Encounter for gynecological examination (general) (routine) without abnormal findings: Secondary | ICD-10-CM

## 2021-12-29 DIAGNOSIS — Z803 Family history of malignant neoplasm of breast: Secondary | ICD-10-CM | POA: Diagnosis not present

## 2021-12-29 DIAGNOSIS — Z23 Encounter for immunization: Secondary | ICD-10-CM | POA: Diagnosis not present

## 2021-12-29 DIAGNOSIS — Z9189 Other specified personal risk factors, not elsewhere classified: Secondary | ICD-10-CM

## 2021-12-29 DIAGNOSIS — Z1231 Encounter for screening mammogram for malignant neoplasm of breast: Secondary | ICD-10-CM

## 2021-12-29 DIAGNOSIS — M25552 Pain in left hip: Secondary | ICD-10-CM

## 2021-12-29 NOTE — Patient Instructions (Signed)
I value your feedback and you entrusting us with your care. If you get a [facility] patient survey, I would appreciate you taking the time to let us know about your experience today. Thank you!  [facility]: [phone]      

## 2021-12-30 ENCOUNTER — Telehealth: Payer: Self-pay

## 2021-12-30 DIAGNOSIS — Z1211 Encounter for screening for malignant neoplasm of colon: Secondary | ICD-10-CM

## 2021-12-30 NOTE — Telephone Encounter (Signed)
Contacted patient in regard to scheduling her colonoscopy.  She said she did not think we were in network with her insurance however she will be turning 43 in December and insurance will be changing.  She has asked for [place] to check to see if her insurance is in network-but at this time we would not know since she will be changing insurances.  She would like to schedule her colonoscopy between January-March 2024.  Informed her that we will call her back in January once she gets her new insurance card and will check at that time to see if she is in network.  Referral will be deferred until January 2024.  Thanks,  [place], [place]

## 2022-02-16 ENCOUNTER — Other Ambulatory Visit: Payer: Self-pay | Admitting: Physician Assistant

## 2022-02-16 DIAGNOSIS — I10 Essential (primary) hypertension: Secondary | ICD-10-CM

## 2022-03-14 DIAGNOSIS — J0100 Acute maxillary sinusitis, unspecified: Secondary | ICD-10-CM | POA: Diagnosis not present

## 2022-03-15 ENCOUNTER — Other Ambulatory Visit: Payer: Self-pay

## 2022-03-15 DIAGNOSIS — Z1211 Encounter for screening for malignant neoplasm of colon: Secondary | ICD-10-CM

## 2022-03-15 MED ORDER — NA SULFATE-K SULFATE-MG SULF 17.5-3.13-1.6 GM/177ML PO SOLN: 1.0000 | Freq: Once | ORAL | 0 refills | Status: AC

## 2022-03-15 NOTE — Telephone Encounter (Signed)
Gastroenterology Pre-Procedure Review  Request Date: 04/23/22 Requesting Physician: Dr. Marius Ditch  PATIENT REVIEW QUESTIONS: The patient responded to the following health history questions as indicated:    1. Are you having any GI issues? no 2. Do you have a personal history of Polyps? yes (a couple of polyps the first time unsure of date) 3. Do you have a family history of Colon Cancer or Polyps? no 4. Diabetes Mellitus? no however patient takes metformin has been advised to stop on 04/21/22 5. Joint replacements in the past 12 months?no 6. Major health problems in the past 3 months?no 7. Any artificial heart valves, MVP, or defibrillator?no    MEDICATIONS & ALLERGIES:    Patient reports the following regarding taking any anticoagulation/antiplatelet therapy:   Plavix, Coumadin, Eliquis, Xarelto, Lovenox, Pradaxa, Brilinta, or Effient? no Aspirin? 91 mg  Patient confirms/reports the following medications:  Current Outpatient Medications  Medication Sig Dispense Refill   aspirin 81 MG EC tablet Take by mouth.     cetirizine (ZYRTEC) 5 MG tablet Take 1 tablet by mouth daily.     Cholecalciferol (VITAMIN D3 PO) Take 2,000 mg by mouth daily.     lisinopril-hydrochlorothiazide (ZESTORETIC) 10-12.5 MG tablet Take 1 tablet by mouth daily. 90 tablet 3   metFORMIN (GLUCOPHAGE) 1000 MG tablet Take 1 tablet (1,000 mg total) by mouth 2 (two) times daily with a meal. 180 tablet 3   No current facility-administered medications for this visit.    Patient confirms/reports the following allergies:  Allergies  Allergen Reactions   Prednisone Swelling    No orders of the defined types were placed in this encounter.   AUTHORIZATION INFORMATION Primary Insurance: 1D#: Group #:  Secondary Insurance: 1D#: Group #:  SCHEDULE INFORMATION: Date: 04/23/22 Time: Location: [facility]

## 2022-03-15 NOTE — Addendum Note (Signed)
Addended by: Vanetta Mulders on: 03/15/2022 08:42 AM   Modules accepted: Orders

## 2022-03-19 ENCOUNTER — Ambulatory Visit
Admission: RE | Admit: 2022-03-19 | Discharge: 2022-03-19 | Disposition: A | Discharge status: Home / Self Care | Payer: Medicare Other | Source: Ambulatory Visit | Attending: Obstetrics and Gynecology | Admitting: Obstetrics and Gynecology

## 2022-03-19 ENCOUNTER — Telehealth: Payer: Self-pay

## 2022-03-19 DIAGNOSIS — Z1231 Encounter for screening mammogram for malignant neoplasm of breast: Secondary | ICD-10-CM | POA: Insufficient documentation

## 2022-03-19 DIAGNOSIS — Z9189 Other specified personal risk factors, not elsewhere classified: Secondary | ICD-10-CM | POA: Diagnosis not present

## 2022-03-19 DIAGNOSIS — Z803 Family history of malignant neoplasm of breast: Secondary | ICD-10-CM | POA: Insufficient documentation

## 2022-03-19 NOTE — Telephone Encounter (Signed)
duplicate

## 2022-03-22 DIAGNOSIS — E119 Type 2 diabetes mellitus without complications: Secondary | ICD-10-CM | POA: Diagnosis not present

## 2022-03-22 DIAGNOSIS — H25013 Cortical age-related cataract, bilateral: Secondary | ICD-10-CM | POA: Diagnosis not present

## 2022-03-22 DIAGNOSIS — H5203 Hypermetropia, bilateral: Secondary | ICD-10-CM | POA: Diagnosis not present

## 2022-04-04 DIAGNOSIS — N390 Urinary tract infection, site not specified: Secondary | ICD-10-CM | POA: Diagnosis not present

## 2022-04-22 ENCOUNTER — Encounter: Payer: Self-pay | Admitting: Gastroenterology

## 2022-04-23 ENCOUNTER — Ambulatory Visit
Admission: RE | Admit: 2022-04-23 | Discharge: 2022-04-23 | Disposition: A | Discharge status: Home / Self Care | Payer: Medicare Other | Attending: Gastroenterology | Admitting: Gastroenterology

## 2022-04-23 ENCOUNTER — Ambulatory Visit: Payer: Medicare Other | Admitting: Anesthesiology

## 2022-04-23 ENCOUNTER — Encounter
Admission: RE | Disposition: A | Discharge status: Home / Self Care | Payer: Self-pay | Source: Home / Self Care | Attending: Gastroenterology

## 2022-04-23 DIAGNOSIS — I10 Essential (primary) hypertension: Secondary | ICD-10-CM | POA: Diagnosis not present

## 2022-04-23 DIAGNOSIS — Z1211 Encounter for screening for malignant neoplasm of colon: Secondary | ICD-10-CM | POA: Insufficient documentation

## 2022-04-23 DIAGNOSIS — Z8719 Personal history of other diseases of the digestive system: Secondary | ICD-10-CM | POA: Insufficient documentation

## 2022-04-23 DIAGNOSIS — Z6834 Body mass index (BMI) 34.0-34.9, adult: Secondary | ICD-10-CM | POA: Insufficient documentation

## 2022-04-23 DIAGNOSIS — E669 Obesity, unspecified: Secondary | ICD-10-CM | POA: Diagnosis not present

## 2022-04-23 HISTORY — PX: COLONOSCOPY WITH PROPOFOL: SHX5780

## 2022-04-23 SURGERY — COLONOSCOPY WITH PROPOFOL
Anesthesia: General

## 2022-04-23 MED ORDER — LIDOCAINE HCL (CARDIAC) PF 100 MG/5ML IV SOSY
PREFILLED_SYRINGE | INTRAVENOUS | Status: DC | PRN
  Administered 2022-04-23: 50 mg via INTRAVENOUS

## 2022-04-23 MED ORDER — PROPOFOL 500 MG/50ML IV EMUL
INTRAVENOUS | Status: DC | PRN
  Administered 2022-04-23: 120 ug/kg/min via INTRAVENOUS

## 2022-04-23 MED ORDER — PROPOFOL 1000 MG/100ML IV EMUL
INTRAVENOUS | Status: AC
  Filled 2022-04-23: qty 100

## 2022-04-23 MED ORDER — SODIUM CHLORIDE 0.9 % IV SOLN
INTRAVENOUS | Status: DC
  Administered 2022-04-23: 20 mL/h via INTRAVENOUS

## 2022-04-23 MED ORDER — LIDOCAINE HCL (PF) 2 % IJ SOLN
INTRAMUSCULAR | Status: AC
  Filled 2022-04-23: qty 5

## 2022-04-23 MED ORDER — PROPOFOL 10 MG/ML IV BOLUS
INTRAVENOUS | Status: AC
  Filled 2022-04-23: qty 20

## 2022-04-23 MED ORDER — PROPOFOL 10 MG/ML IV BOLUS
INTRAVENOUS | Status: DC | PRN
  Administered 2022-04-23: 100 mg via INTRAVENOUS

## 2022-04-23 NOTE — Op Note (Signed)
[facility] Gastroenterology Patient Name: Sierra Gonzalez Procedure Date: 04/23/2022 7:56 AM MRN: [id number] Account #: [id number] Date of Birth: [birth year] Admit Type: Outpatient Age: 67 Room: [facility] ENDO ROOM 2 Gender: Female Note Status: Finalized Instrument Name: Jasper Riling [id number] Procedure:             Colonoscopy Indications:           Screening for colorectal malignant neoplasm, Last                         colonoscopy: November 2010, Last colonoscopy 10 years                         ago Providers:             Lin Landsman MD, MD Medicines:             General Anesthesia Complications:         No immediate complications. Estimated blood loss: None. Procedure:             Pre-Anesthesia Assessment:                        - Prior to the procedure, a History and Physical was                         performed, and patient medications and allergies were                         reviewed. The patient is competent. The risks and                         benefits of the procedure and the sedation options and                         risks were discussed with the patient. All questions                         were answered and informed consent was obtained.                         Patient identification and proposed procedure were                         verified by the physician, the nurse, the                         anesthesiologist, the anesthetist and the technician                         in the pre-procedure area in the procedure room in the                         endoscopy suite. Mental Status Examination: alert and                         oriented. Airway Examination: normal oropharyngeal                         airway and neck mobility. Respiratory Examination:  clear to auscultation. CV Examination: normal.                         Prophylactic Antibiotics: The patient does not require                         prophylactic  antibiotics. Prior Anticoagulants: The                         patient has taken no anticoagulant or antiplatelet                         agents. ASA Grade Assessment: II - A patient with mild                         systemic disease. After reviewing the risks and                         benefits, the patient was deemed in satisfactory                         condition to undergo the procedure. The anesthesia                         plan was to use general anesthesia. Immediately prior                         to administration of medications, the patient was                         re-assessed for adequacy to receive sedatives. The                         heart rate, respiratory rate, oxygen saturations,                         blood pressure, adequacy of pulmonary ventilation, and                         response to care were monitored throughout the                         procedure. The physical status of the patient was                         re-assessed after the procedure.                        After obtaining informed consent, the colonoscope was                         passed under direct vision. Throughout the procedure,                         the patient's blood pressure, pulse, and oxygen                         saturations were monitored continuously. The  Colonoscope was introduced through the anus and                         advanced to the the cecum, identified by appendiceal                         orifice and ileocecal valve. The colonoscopy was                         performed without difficulty. The patient tolerated                         the procedure well. The quality of the bowel                         preparation was evaluated using the BBPS [facility] Bowel                         Preparation Scale) with scores of: Right Colon = 3,                         Transverse Colon = 3 and Left Colon = 3 (entire mucosa                         seen  well with no residual staining, small fragments                         of stool or opaque liquid). The total BBPS score                         equals 9. The ileocecal valve, appendiceal orifice,                         and rectum were photographed. Findings:      The perianal and digital rectal examinations were normal. Pertinent       negatives include normal sphincter tone and no palpable rectal lesions.      The entire examined colon appeared normal.      The retroflexed view of the distal rectum and anal verge was normal and       showed no anal or rectal abnormalities. Impression:            - The entire examined colon is normal.                        - The distal rectum and anal verge are normal on                         retroflexion view.                        - No specimens collected. Recommendation:        - Discharge patient to home (with escort).                        - Resume previous diet today.                        -  Continue present medications.                        - Repeat colonoscopy in 10 years for screening                         purposes. Procedure Code(s):     --- Professional ---                        [id number], Colorectal cancer screening; colonoscopy on                         individual not meeting criteria for high risk Diagnosis Code(s):     --- Professional ---                        Z12.11, Encounter for screening for malignant neoplasm                         of colon CPT copyright 2022 American Medical Association. All rights reserved. The codes documented in this report are preliminary and upon coder review may  be revised to meet current compliance requirements. Dr. Ulyess Mort Lin Landsman MD, MD 04/23/2022 8:15:57 AM This report has been signed electronically. Number of Addenda: 0 Note Initiated On: 04/23/2022 7:56 AM Scope Withdrawal Time: 0 hours 7 minutes 54 seconds  Total Procedure Duration: 0 hours 12 minutes 38 seconds   Estimated Blood Loss:  Estimated blood loss: none.      [facility]

## 2022-04-23 NOTE — Anesthesia Preprocedure Evaluation (Signed)
Anesthesia Evaluation  Patient identified by MRN, date of birth, ID band Patient awake    Reviewed: Allergy & Precautions, NPO status , Patient's Chart, lab work & pertinent test results  Airway Mallampati: II  TM Distance: >3 FB Neck ROM: full    Dental  (+) Teeth Intact   Pulmonary neg pulmonary ROS   Pulmonary exam normal breath sounds clear to auscultation       Cardiovascular Exercise Tolerance: Good hypertension, Pt. on medications negative cardio ROS Normal cardiovascular exam Rhythm:Regular     Neuro/Psych negative neurological ROS  negative psych ROS   GI/Hepatic negative GI ROS, Neg liver ROS,,,  Endo/Other  negative endocrine ROS  Morbid obesity  Renal/GU negative Renal ROS  negative genitourinary   Musculoskeletal   Abdominal  (+) + obese  Peds negative pediatric ROS (+)  Hematology negative hematology ROS (+)   Anesthesia Other Findings Past Medical History: No date: Allergic rhinitis No date: Family history of breast cancer     Comment:  9/18 genetic testing letter sent 04/21/2015: Fracture lumbar vertebra-closed ([place])     Comment:  FELL DOWN STEPS-FX A BONE IN LOWER PART OF HER BACK;  10/21/14; 11/28/15: History of mammogram     Comment:  NEG; ADD VIEWS LEFT BREAST-BIRADS 3 No date: Hypertension No date: Obesity (BMI 30-39.9) No date: Umbilical hernia  Past Surgical History: age 67: BACK SURGERY     Comment:  had a bone inserted in upper spine No date: CESAREAN SECTION     Comment:  x2 01/2009: COLONOSCOPY No date: DILATION AND CURETTAGE OF UTERUS 2003: HYSTEROSCOPY WITH D & C  BMI    Body Mass Index: 34.58 kg/m      Reproductive/Obstetrics negative OB ROS                             Anesthesia Physical Anesthesia Plan  ASA: 2  Anesthesia Plan: General   Post-op Pain Management:    Induction: Intravenous  PONV Risk Score and Plan: Propofol infusion  and TIVA  Airway Management Planned: Natural Airway and Nasal Cannula  Additional Equipment:   Intra-op Plan:   Post-operative Plan:   Informed Consent: I have reviewed the patients History and Physical, chart, labs and discussed the procedure including the risks, benefits and alternatives for the proposed anesthesia with the patient or authorized representative who has indicated his/her understanding and acceptance.     Dental Advisory Given  Plan Discussed with: CRNA and Surgeon  Anesthesia Plan Comments:        Anesthesia Quick Evaluation

## 2022-04-23 NOTE — Transfer of Care (Signed)
Immediate Anesthesia Transfer of Care Note  Patient: Sierra Gonzalez  Procedure(s) Performed: COLONOSCOPY WITH PROPOFOL  Patient Location: PACU and Endoscopy Unit  Anesthesia Type:General  Level of Consciousness: drowsy and patient cooperative  Airway & Oxygen Therapy: Patient Spontanous Breathing  Post-op Assessment: Report given to RN and Post -op Vital signs reviewed and stable  Post vital signs: Reviewed and stable  Last Vitals:  Vitals Value Taken Time  BP 138/81 04/23/22 0818  Temp 36.4 C 04/23/22 0818  Pulse 73 04/23/22 0820  Resp 19 04/23/22 0820  SpO2 100 % 04/23/22 0820  Vitals shown include unvalidated device data.  Last Pain:  Vitals:   04/23/22 0818  TempSrc: Temporal  PainSc: 0-No pain         Complications: No notable events documented.

## 2022-04-23 NOTE — H&P (Signed)
Sierra Darby, MD [address]  [place]  [place], [place] [address]  Main: [phone]  Fax: [phone] Pager: [phone]  Primary Care Physician:  Chad Cordial, PA-C Primary Gastroenterologist:  Dr. Cephas Gonzalez  Pre-Procedure History & Physical: HPI:  Sierra Gonzalez is a 67 y.o. female is here for an colonoscopy.   Past Medical History:  Diagnosis Date   Allergic rhinitis    Family history of breast cancer    9/18 genetic testing letter sent   Fracture lumbar vertebra-closed ([facility]) 04/21/2015   FELL DOWN STEPS-FX A BONE IN LOWER PART OF HER BACK;    History of mammogram 10/21/14; 11/28/15   NEG; ADD VIEWS LEFT BREAST-BIRADS 3   Hypertension    Obesity (BMI 30-39.9)    Umbilical hernia     Past Surgical History:  Procedure Laterality Date   BACK SURGERY  age 62   had a bone inserted in upper spine   CESAREAN SECTION     x2   COLONOSCOPY  01/2009   DILATION AND CURETTAGE OF UTERUS     HYSTEROSCOPY WITH D & C  2003    Prior to Admission medications   Medication Sig Start Date End Date Taking? Authorizing Provider  aspirin 81 MG EC tablet Take by mouth.   Yes [provider]  azelastine (ASTELIN) 0.1 % nasal spray Place into the nose. 03/14/22  Yes [provider]  cetirizine (ZYRTEC) 5 MG tablet Take 1 tablet by mouth daily.   Yes [provider]  Cholecalciferol (VITAMIN D3 PO) Take 2,000 mg by mouth daily.   Yes [provider]  lisinopril-hydrochlorothiazide (ZESTORETIC) 10-12.5 MG tablet Take 1 tablet by mouth once daily 03/19/22  Yes Sable Feil, PA-C  metFORMIN (GLUCOPHAGE) 1000 MG tablet Take 1 tablet (1,000 mg total) by mouth 2 (two) times daily with a meal. 01/22/21  Yes Sable Feil, PA-C  Na Sulfate-K Sulfate-Mg Sulf 17.5-3.13-1.6 GM/177ML SOLN Take by mouth. 03/15/22  Yes [provider]  promethazine-dextromethorphan (PROMETHAZINE-DM) 6.25-15 MG/5ML syrup Take by mouth. 03/14/22  Yes [provider]    Allergies as of 03/15/2022 - Review Complete 12/29/2021  Allergen Reaction Noted   Prednisone Swelling 04/24/2015    Family History  Problem Relation Age of Onset   Breast cancer Mother 63       premenopausal   Diabetes Father    Hypertension Father    Kidney cancer Father    Breast cancer Sister 14   Diabetes Sister    Diabetes Mellitus II Brother    Breast cancer Maternal Aunt 67   Breast cancer Paternal Aunt 3   Breast cancer Paternal Aunt 50   Colon cancer Maternal Grandmother 70    Social History   Socioeconomic History   Marital status: Married    Spouse name: Winford   Number of children: 2   Years of education: 14   Highest education level: Not on file  Occupational History   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  Tobacco Use   Smoking status: Never   Smokeless tobacco: Never  Vaping Use   Vaping Use: Never used  Substance and Sexual Activity   Alcohol use: No   Drug use: No   Sexual activity: Not Currently    Partners: Male    Birth control/protection: Post-menopausal  Other Topics Concern   Not on file  Social History Narrative   Not on file   Social Determinants of  Health   Financial Resource Strain: Not on file  Food Insecurity: Not on file  Transportation Needs: Not on file  Physical Activity: Not on file  Stress: Not on file  Social Connections: Not on file  Intimate Partner Violence: Not on file    Review of Systems: See HPI, otherwise negative ROS  Physical Exam: BP (!) 159/83   Pulse 73   Temp (!) 96.7 F (35.9 C) (Temporal)   Resp 20   Ht 5' 5"$  (1.651 m)   Wt 94.3 kg   SpO2 100%   BMI 34.58 kg/m  General:   Alert,  pleasant and cooperative in NAD Head:  Normocephalic and atraumatic. Neck:  Supple; no masses or thyromegaly. Lungs:  Clear throughout to auscultation.    Heart:  Regular rate and rhythm. Abdomen:  Soft, nontender and nondistended. Normal bowel sounds, without guarding, and  without rebound.   Neurologic:  Alert and  oriented x4;  grossly normal neurologically.  Impression/Plan: Sierra Gonzalez is here for an colonoscopy to be performed for colon cancer screening  Risks, benefits, limitations, and alternatives regarding  colonoscopy have been reviewed with the patient.  Questions have been answered.  All parties agreeable.   Sherri Sear, MD  04/23/2022, 7:51 AM

## 2022-04-23 NOTE — Anesthesia Postprocedure Evaluation (Signed)
Anesthesia Post Note  Patient: Sierra Gonzalez  Procedure(s) Performed: COLONOSCOPY WITH PROPOFOL  Patient location during evaluation: PACU Anesthesia Type: General Level of consciousness: awake Pain management: satisfactory to patient Vital Signs Assessment: post-procedure vital signs reviewed and stable Respiratory status: spontaneous breathing and nonlabored ventilation Cardiovascular status: stable Anesthetic complications: no   No notable events documented.   Last Vitals:  Vitals:   04/23/22 0818 04/23/22 0828  BP: 138/81 (!) 156/97  Pulse: 75   Resp: 18   Temp: (!) 36.4 C   SpO2: 98%     Last Pain:  Vitals:   04/23/22 0828  TempSrc:   PainSc: 0-No pain                 VAN STAVEREN,Emila Steinhauser

## 2022-04-26 ENCOUNTER — Encounter: Payer: Self-pay | Admitting: Gastroenterology

## 2022-05-31 DIAGNOSIS — Z780 Asymptomatic menopausal state: Secondary | ICD-10-CM | POA: Diagnosis not present

## 2022-05-31 DIAGNOSIS — M25562 Pain in left knee: Secondary | ICD-10-CM | POA: Diagnosis not present

## 2022-05-31 DIAGNOSIS — M25552 Pain in left hip: Secondary | ICD-10-CM | POA: Diagnosis not present

## 2022-05-31 DIAGNOSIS — E119 Type 2 diabetes mellitus without complications: Secondary | ICD-10-CM | POA: Diagnosis not present

## 2022-05-31 DIAGNOSIS — G8929 Other chronic pain: Secondary | ICD-10-CM | POA: Diagnosis not present

## 2022-05-31 DIAGNOSIS — I10 Essential (primary) hypertension: Secondary | ICD-10-CM | POA: Diagnosis not present

## 2022-05-31 DIAGNOSIS — Z0000 Encounter for general adult medical examination without abnormal findings: Secondary | ICD-10-CM | POA: Diagnosis not present

## 2022-05-31 DIAGNOSIS — E669 Obesity, unspecified: Secondary | ICD-10-CM | POA: Diagnosis not present

## 2022-06-11 DIAGNOSIS — M8588 Other specified disorders of bone density and structure, other site: Secondary | ICD-10-CM | POA: Diagnosis not present

## 2022-12-03 DIAGNOSIS — I10 Essential (primary) hypertension: Secondary | ICD-10-CM | POA: Diagnosis not present

## 2022-12-03 DIAGNOSIS — E669 Obesity, unspecified: Secondary | ICD-10-CM | POA: Diagnosis not present

## 2022-12-03 DIAGNOSIS — E119 Type 2 diabetes mellitus without complications: Secondary | ICD-10-CM | POA: Diagnosis not present

## 2023-01-04 ENCOUNTER — Encounter: Payer: Self-pay | Admitting: Obstetrics and Gynecology

## 2023-01-04 ENCOUNTER — Ambulatory Visit: Payer: Medicare Other | Admitting: Obstetrics and Gynecology

## 2023-01-04 VITALS — BP 147/82 | HR 85 | Ht 64.0 in | Wt 213.0 lb

## 2023-01-04 DIAGNOSIS — Z01419 Encounter for gynecological examination (general) (routine) without abnormal findings: Secondary | ICD-10-CM

## 2023-01-04 DIAGNOSIS — Z23 Encounter for immunization: Secondary | ICD-10-CM | POA: Diagnosis not present

## 2023-01-04 DIAGNOSIS — Z9189 Other specified personal risk factors, not elsewhere classified: Secondary | ICD-10-CM

## 2023-01-04 DIAGNOSIS — Z803 Family history of malignant neoplasm of breast: Secondary | ICD-10-CM

## 2023-01-04 DIAGNOSIS — Z1231 Encounter for screening mammogram for malignant neoplasm of breast: Secondary | ICD-10-CM

## 2023-01-04 NOTE — Patient Instructions (Signed)
I value your feedback and you entrusting us with your care. If you get a [facility] patient survey, I would appreciate you taking the time to let us know about your experience today. Thank you!  [facility] ([place])--[phone]  

## 2023-01-04 NOTE — Progress Notes (Signed)
PCP: Rica Records, PA-C   Chief Complaint  Patient presents with   Gynecologic Exam    No concerns    HPI:      [name] is a 67 y.o. [phone] whose LMP was No LMP recorded. Patient is postmenopausal., presents today for her Medicare annual examination.  Her menses are absent due to menopause. No PMB. She does have occas vasomotor sx.   Sex activity: not sexually active. She does not have vaginal dryness. No vag sx.  Last Pap: 12/24/20   Results were: no abnormalities /neg HPV DNA   Last mammogram: 03/19/22 Results were: normal--routine follow-up in 12 months There is no FH of breast cancer in her mother, sister, maternal aunt, and two paternal aunts. Genetic testing declined by pt several times. Doesn't meet medicare guidelines for testing now. IBIS=27.1%. There is no FH of ovarian cancer. The patient does self-breast exams.  Colonoscopy: 2/24 at [facility]; repeat due after 10 yrs.  Tobacco use: The patient denies current or previous tobacco use. Alcohol use: none   No drug use Exercise: moderately active  She does get adequate calcium and Vitamin D in her diet.  Labs with PCP   Past Medical History:  Diagnosis Date   Allergic rhinitis    Family history of breast cancer    9/18 genetic testing letter sent   Fracture lumbar vertebra-closed (HCC) 04/21/2015   FELL DOWN STEPS-FX A BONE IN LOWER PART OF HER BACK;    History of mammogram 10/21/14; 11/28/15   NEG; ADD VIEWS LEFT BREAST-BIRADS 3   Hypertension    Obesity (BMI 30-39.9)    Umbilical hernia     Past Surgical History:  Procedure Laterality Date   BACK SURGERY  age 89   had a bone inserted in upper spine   CESAREAN SECTION     x2   COLONOSCOPY  01/2009   COLONOSCOPY WITH PROPOFOL N/A 04/23/2022   Procedure: COLONOSCOPY WITH PROPOFOL;  Surgeon: Toney Reil, MD;  Location: [facility] END[facility]COPY;  Service: Gastroenterology;  Laterality: N/A;   DILATION AND CURETTAGE OF UTERUS     HYSTEROSCOPY  WITH D & C  2003    Family History  Problem Relation Age of Onset   Breast cancer Mother 24       premenopausal   Diabetes Father    Hypertension Father    Kidney cancer Father    Breast cancer Sister 74   Diabetes Sister    Diabetes Mellitus II Brother    Breast cancer Maternal Aunt 14   Breast cancer Paternal Aunt 82   Breast cancer Paternal Aunt 78   Colon cancer Maternal Grandmother 63    Social History   Socioeconomic History   Marital status: Married    Spouse name: Winford   Number of children: 2   Years of education: 14   Highest education level: Not on file  Occupational History   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  Tobacco Use   Smoking status: Never   Smokeless tobacco: Never  Vaping Use   Vaping status: Never Used  Substance and Sexual Activity   Alcohol use: Yes    Comment: occ   Drug use: No   Sexual activity: Not Currently    Partners: Male    Birth control/protection: Post-menopausal  Other Topics Concern   Not on file  Social History Narrative   Not on file   Social Determinants of Health  Financial Resource Strain: Low Risk  (12/03/2022)   Received from [facility]   Overall Financial Resource Strain (CARDIA)    Difficulty of Paying Living Expenses: Not hard at all  Food Insecurity: No Food Insecurity (12/03/2022)   Received from [facility]   Hunger Vital Sign    Worried About Running Out of Food in the Last Year: Never true    Ran Out of Food in the Last Year: Never true  Transportation Needs: No Transportation Needs (12/03/2022)   Received from [facility] - Transportation    In the past 12 months, has lack of transportation kept you from medical appointments or from getting medications?: No    Lack of Transportation (Non-Medical): No  Physical Activity: Not on file  Stress: Not on file  Social Connections: Not on file  Intimate Partner Violence: Not  on file     Current Outpatient Medications:    amLODipine (NORVASC) 5 MG tablet, Take by mouth., Disp: , Rfl:    aspirin 81 MG EC tablet, Take by mouth., Disp: , Rfl:    azelastine (ASTELIN) 0.1 % nasal spray, Place into the nose., Disp: , Rfl:    cetirizine (ZYRTEC) 10 MG tablet, Take by mouth., Disp: , Rfl:    Cholecalciferol (VITAMIN D3 PO), Take 2,000 mg by mouth daily., Disp: , Rfl:    promethazine-dextromethorphan (PROMETHAZINE-DM) 6.25-15 MG/5ML syrup, Take by mouth., Disp: , Rfl:    Semaglutide 3 MG TABS, Take by mouth., Disp: , Rfl:      ROS:  Review of Systems  Constitutional:  Negative for fatigue, fever and unexpected weight change.  Respiratory:  Negative for cough, shortness of breath and wheezing.   Cardiovascular:  Negative for chest pain, palpitations and leg swelling.  Gastrointestinal:  Negative for blood in stool, constipation, diarrhea, nausea and vomiting.  Endocrine: Negative for cold intolerance, heat intolerance and polyuria.  Genitourinary:  Negative for dyspareunia, dysuria, flank pain, frequency, genital sores, hematuria, menstrual problem, pelvic pain, urgency, vaginal bleeding, vaginal discharge and vaginal pain.  Musculoskeletal:  Negative for arthralgias, back pain, joint swelling and myalgias.  Skin:  Negative for rash.  Neurological:  Negative for dizziness, syncope, light-headedness, numbness and headaches.  Hematological:  Negative for adenopathy.  Psychiatric/Behavioral:  Negative for agitation, confusion, sleep disturbance and suicidal ideas. The patient is not nervous/anxious.    BREAST: No symptoms    Objective: BP (!) 147/82   Pulse 85   Ht 5\' 4"  (1.626 m)   Wt 213 lb (96.6 kg)   BMI 36.56 kg/m    Physical Exam Constitutional:      Appearance: She is well-developed.  Genitourinary:     Vulva normal.     Right Labia: No rash, tenderness or lesions.    Left Labia: No tenderness, lesions or rash.    No vaginal discharge,  erythema or tenderness.      Right Adnexa: not tender and no mass present.    Left Adnexa: not tender and no mass present.    No cervical friability or polyp.     Uterus is not enlarged or tender.  Breasts:    Right: No mass, nipple discharge, skin change or tenderness.     Left: No mass, nipple discharge, skin change or tenderness.  Neck:     Thyroid: No thyromegaly.  Cardiovascular:     Rate and Rhythm: Normal rate and regular rhythm.     Heart sounds: Normal heart sounds.  No murmur heard. Pulmonary:     Effort: Pulmonary effort is normal.     Breath sounds: Normal breath sounds.  Abdominal:     Palpations: Abdomen is soft.     Tenderness: There is no abdominal tenderness. There is no guarding or rebound.  Musculoskeletal:        General: Normal range of motion.     Cervical back: Normal range of motion.  Lymphadenopathy:     Cervical: No cervical adenopathy.  Neurological:     General: No focal deficit present.     Mental Status: She is alert and oriented to person, place, and time.     Cranial Nerves: No cranial nerve deficit.  Skin:    General: Skin is warm and dry.  Psychiatric:        Mood and Affect: Mood normal.        Behavior: Behavior normal.        Thought Content: Thought content normal.        Judgment: Judgment normal.  Vitals reviewed.     Assessment/Plan:  Encounter for annual routine gynecological examination  Encounter for screening mammogram for malignant neoplasm of breast - Plan: MM 3D SCREENING MAMMOGRAM BILATERAL BREAST; pt to schedule mammo  Family history of breast cancer - Plan: MM 3D SCREENING MAMMOGRAM BILATERAL BREAST; pt doesn't meet cancer genetic testing guidelines now with Medicare, declined multiple times in past  Increased risk of breast cancer - Plan: MM 3D SCREENING MAMMOGRAM BILATERAL BREAST; aware of recommendations of monthly SBE, yearly CBE and mammos, as well as scr breast MRI. Will call for MRI ref prn.   Encounter for  immunization - Plan: Flu vaccine trivalent PF, 6mos and older(Flulaval,Afluria,Fluarix,Fluzone)           GYN counsel breast self exam, mammography screening, menopause, adequate intake of calcium and vitamin D, diet and exercise    F/U  Return in about 1 year (around 01/04/2024).  [facility] [place] B. Kaige Whistler, PA-C 01/04/2023 4:36 PM

## 2023-03-07 DIAGNOSIS — I10 Essential (primary) hypertension: Secondary | ICD-10-CM | POA: Diagnosis not present

## 2023-03-07 DIAGNOSIS — E119 Type 2 diabetes mellitus without complications: Secondary | ICD-10-CM | POA: Diagnosis not present

## 2023-03-07 DIAGNOSIS — Z780 Asymptomatic menopausal state: Secondary | ICD-10-CM | POA: Diagnosis not present

## 2023-03-07 DIAGNOSIS — E669 Obesity, unspecified: Secondary | ICD-10-CM | POA: Diagnosis not present

## 2023-03-23 ENCOUNTER — Ambulatory Visit
Admission: RE | Admit: 2023-03-23 | Discharge: 2023-03-23 | Disposition: A | Discharge status: Home / Self Care | Payer: Medicare Other | Source: Ambulatory Visit | Attending: Obstetrics and Gynecology | Admitting: Obstetrics and Gynecology

## 2023-03-23 DIAGNOSIS — Z803 Family history of malignant neoplasm of breast: Secondary | ICD-10-CM | POA: Diagnosis present

## 2023-03-23 DIAGNOSIS — Z9189 Other specified personal risk factors, not elsewhere classified: Secondary | ICD-10-CM | POA: Diagnosis present

## 2023-03-23 DIAGNOSIS — Z1231 Encounter for screening mammogram for malignant neoplasm of breast: Secondary | ICD-10-CM | POA: Diagnosis present

## 2023-03-25 ENCOUNTER — Encounter: Payer: Self-pay | Admitting: Obstetrics and Gynecology

## 2024-01-09 ENCOUNTER — Ambulatory Visit: Admitting: Obstetrics and Gynecology

## 2024-01-13 ENCOUNTER — Other Ambulatory Visit (HOSPITAL_COMMUNITY)
Admission: RE | Admit: 2024-01-13 | Discharge: 2024-01-13 | Disposition: A | Source: Ambulatory Visit | Attending: Obstetrics and Gynecology | Admitting: Obstetrics and Gynecology

## 2024-01-13 ENCOUNTER — Ambulatory Visit: Admitting: Obstetrics and Gynecology

## 2024-01-13 ENCOUNTER — Encounter: Payer: Self-pay | Admitting: Obstetrics and Gynecology

## 2024-01-13 VITALS — BP 173/75 | HR 80 | Ht 63.0 in | Wt 206.0 lb

## 2024-01-13 DIAGNOSIS — Z124 Encounter for screening for malignant neoplasm of cervix: Secondary | ICD-10-CM | POA: Insufficient documentation

## 2024-01-13 DIAGNOSIS — Z803 Family history of malignant neoplasm of breast: Secondary | ICD-10-CM | POA: Diagnosis not present

## 2024-01-13 DIAGNOSIS — Z01419 Encounter for gynecological examination (general) (routine) without abnormal findings: Secondary | ICD-10-CM | POA: Diagnosis not present

## 2024-01-13 DIAGNOSIS — Z1231 Encounter for screening mammogram for malignant neoplasm of breast: Secondary | ICD-10-CM

## 2024-01-13 DIAGNOSIS — Z9189 Other specified personal risk factors, not elsewhere classified: Secondary | ICD-10-CM | POA: Diagnosis not present

## 2024-01-13 NOTE — Progress Notes (Signed)
 PCP: Watt Bernarda NOVAK, PA-C   Chief Complaint  Patient presents with   Gynecologic Exam    No concerns    HPI:      [name] is a 68 y.o. G2P2002 whose LMP was No LMP recorded. Patient is postmenopausal., presents today for her Medicare annual examination.  Her menses are absent due to menopause. No PMB/pelvic pain. She does still have tolerable hot flashes.    Sex activity: not sexually active. She does not have vaginal dryness. No vag sx.  Last Pap: 12/24/20   Results were: no abnormalities /neg HPV DNA   Last mammogram: 03/23/23 Results were: normal--routine follow-up in 12 months There is no FH of breast cancer in her mother, sister, maternal aunt, and two paternal aunts. Genetic testing declined by pt several times. Doesn't meet medicare guidelines for testing now. IBIS=27.1%. There is no FH of ovarian cancer. The patient does self-breast exams.  Colonoscopy: 2/24 at [facility]; repeat due after 10 yrs.  Tobacco use: The patient denies current or previous tobacco use. Alcohol use: none   No drug use Exercise: moderately active  She does get adequate calcium and Vitamin D in her diet.  Labs with PCP   Past Medical History:  Diagnosis Date   Allergic rhinitis    Family history of breast cancer    9/18 genetic testing letter sent   Fracture lumbar vertebra-closed (HCC) 04/21/2015   FELL DOWN STEPS-FX A BONE IN LOWER PART OF HER BACK;    History of mammogram 10/21/14; 11/28/15   NEG; ADD VIEWS LEFT BREAST-BIRADS 3   Hypertension    Obesity (BMI 30-39.9)    Umbilical hernia     Past Surgical History:  Procedure Laterality Date   BACK SURGERY  age 50   had a bone inserted in upper spine   CESAREAN SECTION     x2   COLONOSCOPY  01/2009   COLONOSCOPY WITH PROPOFOL  N/A 04/23/2022   Procedure: COLONOSCOPY WITH PROPOFOL ;  Surgeon: Unk Corinn Skiff, MD;  Location: [facility] ENDOSCOPY;  Service: Gastroenterology;  Laterality: N/A;   DILATION AND CURETTAGE OF  UTERUS     HYSTEROSCOPY WITH D & C  2003    Family History  Problem Relation Age of Onset   Breast cancer Mother 3       premenopausal   Diabetes Father    Hypertension Father    Kidney cancer Father    Breast cancer Sister 61   Diabetes Sister    Diabetes Mellitus II Brother    Breast cancer Maternal Aunt 69   Breast cancer Paternal Aunt 87   Breast cancer Paternal Aunt 25   Colon cancer Maternal Grandmother 27    Social History   Socioeconomic History   Marital status: Married    Spouse name: Winford   Number of children: 2   Years of education: 14   Highest education level: Not on file  Occupational History   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  Tobacco Use   Smoking status: Never   Smokeless tobacco: Never  Vaping Use   Vaping status: Never Used  Substance and Sexual Activity   Alcohol use: Yes    Comment: occ   Drug use: No   Sexual activity: Not Currently    Partners: Male    Birth control/protection: Post-menopausal  Other Topics Concern   Not on file  Social History Narrative   Not on file   Social Drivers of  Health   Financial Resource Strain: Low Risk  (06/02/2023)   Received from [facility]   Overall Financial Resource Strain (CARDIA)    Difficulty of Paying Living Expenses: Not hard at all  Food Insecurity: No Food Insecurity (06/02/2023)   Received from [facility]   Hunger Vital Sign    Within the past 12 months, you worried that your food would run out before you got the money to buy more.: Never true    Within the past 12 months, the food you bought just didn't last and you didn't have money to get more.: Never true  Transportation Needs: No Transportation Needs (06/02/2023)   Received from [facility] - Transportation    In the past 12 months, has lack of transportation kept you from medical appointments or from getting medications?: No    Lack of  Transportation (Non-Medical): No  Physical Activity: Not on file  Stress: Not on file  Social Connections: Not on file  Intimate Partner Violence: Not on file     Current Outpatient Medications:    amLODipine (NORVASC) 10 MG tablet, Take 10 mg by mouth daily., Disp: , Rfl:    aspirin 81 MG EC tablet, Take by mouth., Disp: , Rfl:    cetirizine (ZYRTEC) 10 MG tablet, Take by mouth., Disp: , Rfl:    Cholecalciferol (VITAMIN D3 PO), Take 2,000 mg by mouth daily., Disp: , Rfl:    hydrochlorothiazide  (HYDRODIURIL ) 12.5 MG tablet, Take 12.5 mg by mouth., Disp: , Rfl:    RYBELSUS 14 MG TABS, Take 1 tablet by mouth daily., Disp: , Rfl:      ROS:  Review of Systems  Constitutional:  Negative for fatigue, fever and unexpected weight change.  Respiratory:  Negative for cough, shortness of breath and wheezing.   Cardiovascular:  Negative for chest pain, palpitations and leg swelling.  Gastrointestinal:  Negative for blood in stool, constipation, diarrhea, nausea and vomiting.  Endocrine: Negative for cold intolerance, heat intolerance and polyuria.  Genitourinary:  Negative for dyspareunia, dysuria, flank pain, frequency, genital sores, hematuria, menstrual problem, pelvic pain, urgency, vaginal bleeding, vaginal discharge and vaginal pain.  Musculoskeletal:  Negative for arthralgias, back pain, joint swelling and myalgias.  Skin:  Negative for rash.  Neurological:  Negative for dizziness, syncope, light-headedness, numbness and headaches.  Hematological:  Negative for adenopathy.  Psychiatric/Behavioral:  Negative for agitation, confusion, sleep disturbance and suicidal ideas. The patient is not nervous/anxious.    BREAST: No symptoms    Objective: BP (!) 145/71   Pulse 81   Ht 5' 3 (1.6 m)   Wt 206 lb (93.4 kg)   BMI 36.49 kg/m    Physical Exam Constitutional:      Appearance: She is well-developed.  Genitourinary:     Vulva normal.     Right Labia: No rash, tenderness or  lesions.    Left Labia: No tenderness, lesions or rash.    No vaginal discharge, erythema or tenderness.     Moderate vaginal atrophy present.     Right Adnexa: not tender and no mass present.    Left Adnexa: not tender and no mass present.    No cervical friability or polyp.     Uterus is not enlarged or tender.  Breasts:    Right: No mass, nipple discharge, skin change or tenderness.     Left: No mass, nipple discharge, skin change or tenderness.  Neck:     Thyroid :  No thyromegaly.  Cardiovascular:     Rate and Rhythm: Normal rate and regular rhythm.     Heart sounds: Normal heart sounds. No murmur heard. Pulmonary:     Effort: Pulmonary effort is normal.     Breath sounds: Normal breath sounds.  Abdominal:     Palpations: Abdomen is soft.     Tenderness: There is no abdominal tenderness. There is no guarding or rebound.  Musculoskeletal:        General: Normal range of motion.     Cervical back: Normal range of motion.  Lymphadenopathy:     Cervical: No cervical adenopathy.  Neurological:     General: No focal deficit present.     Mental Status: She is alert and oriented to person, place, and time.     Cranial Nerves: No cranial nerve deficit.  Skin:    General: Skin is warm and dry.  Psychiatric:        Mood and Affect: Mood normal.        Behavior: Behavior normal.        Thought Content: Thought content normal.        Judgment: Judgment normal.  Vitals reviewed.     Assessment/Plan:  Encounter for annual routine gynecological examination  Cervical cancer screening - Plan: Cytology - PAP  Encounter for screening mammogram for malignant neoplasm of breast - Plan: MM 3D SCREENING MAMMOGRAM BILATERAL BREAST; pt to schedule mammo  Family history of breast cancer - Plan: MM 3D SCREENING MAMMOGRAM BILATERAL BREAST; pt doesn't meet cancer genetic testing guidelines now with Medicare, declined multiple times in past  Increased risk of breast cancer - Plan: MM 3D  SCREENING MAMMOGRAM BILATERAL BREAST; aware of recommendations of monthly SBE, yearly CBE and mammos, as well as scr breast MRI. Will call for MRI ref prn. Will be candidate for CT enhanced mammo in future.           GYN counsel breast self exam, mammography screening, menopause, adequate intake of calcium and vitamin D, diet and exercise    F/U  Return in about 1 year (around 01/12/2025).  Emeree Mahler B. Giani Betzold, PA-C 01/13/2024 4:09 PM

## 2024-01-13 NOTE — Patient Instructions (Signed)
 I value your feedback and you entrusting [place] with your care. If you get a [facility] patient survey, I would appreciate you taking the time to let us know about your experience today. Thank you!  [facility] ([place])--[phone]

## 2024-01-17 LAB — CYTOLOGY - PAP
Adequacy: ABSENT
Diagnosis: NEGATIVE

## 2024-03-23 ENCOUNTER — Ambulatory Visit
Admission: RE | Admit: 2024-03-23 | Discharge: 2024-03-23 | Disposition: A | Discharge status: Home / Self Care | Source: Ambulatory Visit | Attending: Obstetrics and Gynecology | Admitting: Obstetrics and Gynecology

## 2024-03-23 DIAGNOSIS — Z1231 Encounter for screening mammogram for malignant neoplasm of breast: Secondary | ICD-10-CM | POA: Insufficient documentation

## 2024-03-23 DIAGNOSIS — Z9189 Other specified personal risk factors, not elsewhere classified: Secondary | ICD-10-CM | POA: Insufficient documentation

## 2024-03-23 DIAGNOSIS — Z803 Family history of malignant neoplasm of breast: Secondary | ICD-10-CM | POA: Diagnosis present

## 2024-03-28 ENCOUNTER — Other Ambulatory Visit: Payer: Self-pay | Admitting: Obstetrics and Gynecology

## 2024-03-28 DIAGNOSIS — R928 Other abnormal and inconclusive findings on diagnostic imaging of breast: Secondary | ICD-10-CM

## 2024-03-29 ENCOUNTER — Ambulatory Visit: Payer: Self-pay | Admitting: Obstetrics and Gynecology

## 2024-04-06 ENCOUNTER — Ambulatory Visit
Admission: RE | Admit: 2024-04-06 | Discharge: 2024-04-06 | Disposition: A | Source: Ambulatory Visit | Attending: Obstetrics and Gynecology

## 2024-04-06 DIAGNOSIS — R928 Other abnormal and inconclusive findings on diagnostic imaging of breast: Secondary | ICD-10-CM

## 2024-04-09 ENCOUNTER — Ambulatory Visit: Payer: Self-pay | Admitting: Obstetrics and Gynecology
